# Patient Record
Sex: Male | Born: 1960 | Race: White | Hispanic: No | Marital: Married | Smoking: Current every day smoker
Health system: Southern US, Community
[De-identification: ages and names within clinical notes are randomized; demographics above are authoritative.]

## PROBLEM LIST (undated history)

## (undated) DIAGNOSIS — Z87442 Personal history of urinary calculi: Secondary | ICD-10-CM

## (undated) DIAGNOSIS — E7800 Pure hypercholesterolemia, unspecified: Secondary | ICD-10-CM

## (undated) DIAGNOSIS — N200 Calculus of kidney: Secondary | ICD-10-CM

## (undated) DIAGNOSIS — G4730 Sleep apnea, unspecified: Secondary | ICD-10-CM

## (undated) HISTORY — PX: HERNIA REPAIR: SHX51

## (undated) HISTORY — DX: Calculus of kidney: N20.0

---
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
## 1997-02-16 HISTORY — PX: HAND SURGERY: SHX662

## 2006-09-20 ENCOUNTER — Emergency Department: Payer: Self-pay | Admitting: Emergency Medicine

## 2007-04-06 ENCOUNTER — Emergency Department: Payer: Self-pay | Admitting: Emergency Medicine

## 2007-08-13 ENCOUNTER — Emergency Department: Payer: Self-pay | Admitting: Internal Medicine

## 2011-04-29 ENCOUNTER — Emergency Department: Payer: Self-pay

## 2011-05-01 ENCOUNTER — Emergency Department: Payer: Self-pay | Admitting: Emergency Medicine

## 2011-05-03 LAB — WOUND CULTURE

## 2011-09-29 ENCOUNTER — Emergency Department: Payer: Self-pay | Admitting: Emergency Medicine

## 2011-10-02 ENCOUNTER — Emergency Department: Payer: Self-pay | Admitting: *Deleted

## 2014-02-02 DIAGNOSIS — Z720 Tobacco use: Secondary | ICD-10-CM | POA: Insufficient documentation

## 2014-02-02 DIAGNOSIS — I10 Essential (primary) hypertension: Secondary | ICD-10-CM | POA: Insufficient documentation

## 2014-12-12 ENCOUNTER — Emergency Department: Payer: PRIVATE HEALTH INSURANCE

## 2014-12-12 ENCOUNTER — Emergency Department
Admission: EM | Admit: 2014-12-12 | Discharge: 2014-12-12 | Disposition: A | Discharge status: Home / Self Care | Payer: PRIVATE HEALTH INSURANCE | Attending: Student | Admitting: Student

## 2014-12-12 DIAGNOSIS — N201 Calculus of ureter: Secondary | ICD-10-CM

## 2014-12-12 DIAGNOSIS — R109 Unspecified abdominal pain: Secondary | ICD-10-CM | POA: Diagnosis present

## 2014-12-12 DIAGNOSIS — R52 Pain, unspecified: Secondary | ICD-10-CM

## 2014-12-12 DIAGNOSIS — Z79899 Other long term (current) drug therapy: Secondary | ICD-10-CM | POA: Insufficient documentation

## 2014-12-12 DIAGNOSIS — Z720 Tobacco use: Secondary | ICD-10-CM | POA: Insufficient documentation

## 2014-12-12 HISTORY — DX: Sleep apnea, unspecified: G47.30

## 2014-12-12 HISTORY — DX: Pure hypercholesterolemia, unspecified: E78.00

## 2014-12-12 LAB — COMPREHENSIVE METABOLIC PANEL
ALBUMIN: 4 g/dL (ref 3.5–5.0)
ALK PHOS: 107 U/L (ref 38–126)
ALT: 21 U/L (ref 17–63)
AST: 18 U/L (ref 15–41)
Anion gap: 8 (ref 5–15)
BUN: 12 mg/dL (ref 6–20)
CALCIUM: 9 mg/dL (ref 8.9–10.3)
CO2: 25 mmol/L (ref 22–32)
CREATININE: 0.94 mg/dL (ref 0.61–1.24)
Chloride: 103 mmol/L (ref 101–111)
GFR calc Af Amer: >60 mL/min (ref >60)
GFR calc non Af Amer: >60 mL/min (ref >60)
GLUCOSE: 137 mg/dL — AB (ref 65–99)
Potassium: 4.3 mmol/L (ref 3.5–5.1)
SODIUM: 136 mmol/L (ref 135–145)
Total Bilirubin: 0.5 mg/dL (ref 0.3–1.2)
Total Protein: 7.7 g/dL (ref 6.5–8.1)

## 2014-12-12 LAB — CBC WITH DIFFERENTIAL/PLATELET
Basophils Absolute: 0.1 10*3/uL (ref 0–0.1)
Basophils Relative: 1 %
Eosinophils Absolute: 0 10*3/uL (ref 0–0.7)
Eosinophils Relative: 0 %
HCT: 46.9 % (ref 40.0–52.0)
HEMOGLOBIN: 15.7 g/dL (ref 13.0–18.0)
Lymphocytes Relative: 10 %
Lymphs Abs: 1.8 10*3/uL (ref 1.0–3.6)
MCH: 31.1 pg (ref 26.0–34.0)
MCHC: 33.4 g/dL (ref 32.0–36.0)
MCV: 93.1 fL (ref 80.0–100.0)
MONO ABS: 0.6 10*3/uL (ref 0.2–1.0)
MONOS PCT: 3 %
Neutro Abs: 15.7 10*3/uL — ABNORMAL HIGH (ref 1.4–6.5)
Neutrophils Relative %: 86 %
Platelets: 303 10*3/uL (ref 150–440)
RBC: 5.04 MIL/uL (ref 4.40–5.90)
RDW: 13.2 % (ref 11.5–14.5)
WBC: 18.2 10*3/uL — ABNORMAL HIGH (ref 3.8–10.6)

## 2014-12-12 LAB — URINALYSIS COMPLETE WITH MICROSCOPIC (ARMC ONLY)
BACTERIA UA: NONE SEEN
Bilirubin Urine: NEGATIVE
Glucose, UA: NEGATIVE mg/dL
Leukocytes, UA: NEGATIVE
NITRITE: NEGATIVE
PH: 5 (ref 5.0–8.0)
PROTEIN: NEGATIVE mg/dL
SPECIFIC GRAVITY, URINE: 1.028 (ref 1.005–1.030)
Squamous Epithelial / LPF: NONE SEEN

## 2014-12-12 LAB — LIPASE, BLOOD: Lipase: 19 U/L (ref 11–51)

## 2014-12-12 MED ORDER — MORPHINE SULFATE (PF) 4 MG/ML IV SOLN
4.0000 mg | Freq: Once | INTRAVENOUS | Status: AC
  Administered 2014-12-12: 4 mg via INTRAVENOUS
  Filled 2014-12-12: qty 1

## 2014-12-12 MED ORDER — ONDANSETRON HCL 4 MG/2ML IJ SOLN
4.0000 mg | Freq: Once | INTRAMUSCULAR | Status: AC
  Administered 2014-12-12: 4 mg via INTRAVENOUS
  Filled 2014-12-12: qty 2

## 2014-12-12 MED ORDER — KETOROLAC TROMETHAMINE 30 MG/ML IJ SOLN
30.0000 mg | Freq: Once | INTRAMUSCULAR | Status: AC
  Administered 2014-12-12: 30 mg via INTRAVENOUS
  Filled 2014-12-12: qty 1

## 2014-12-12 MED ORDER — ONDANSETRON 4 MG PO TBDP: 4.0000 mg | ORAL_TABLET | Freq: Three times a day (TID) | ORAL | Status: DC | PRN

## 2014-12-12 MED ORDER — TAMSULOSIN HCL 0.4 MG PO CAPS: 0.4000 mg | ORAL_CAPSULE | Freq: Every day | ORAL | Status: DC

## 2014-12-12 MED ORDER — SODIUM CHLORIDE 0.9 % IV BOLUS (SEPSIS)
1000.0000 mL | Freq: Once | INTRAVENOUS | Status: AC
  Administered 2014-12-12: 1000 mL via INTRAVENOUS

## 2014-12-12 MED ORDER — OXYCODONE HCL 5 MG PO TABS: 5.0000 mg | ORAL_TABLET | Freq: Four times a day (QID) | ORAL | Status: DC | PRN

## 2014-12-12 NOTE — ED Notes (Signed)
Patient transported to CT 

## 2014-12-12 NOTE — ED Notes (Signed)
Patient complains of sharp pain to right upper quadrant abdominal pain that started last night at 9pm.  Patient reports having normal BM this morning and is passing gas.

## 2014-12-12 NOTE — ED Provider Notes (Signed)
[facility] Emergency Department Provider Note  ____________________________________________  Time seen: Approximately 8:13 AM  I have reviewed the triage vital signs and the nursing notes.   HISTORY  Chief Complaint Abdominal Pain    HPI Sean Sosa is a 54 y.o. male with history of obstructive sleep apnea and hyperlipidemia who presents for evaluation of gradual onset right-sided abdominal pain which began at 9 PM last night and has been worsening. He reports it has been constant and waxing and waning, currently severe, no modifying factors. It feels sharp. He has had nausea but no vomiting, diarrhea, fevers or chills. No chest pain or difficulty breathing. No modifying factors. He has never had this before. No dysuria or hematuria.   Past Medical History  Diagnosis Date  . Sleep apnea   . Hypercholesteremia     There are no active problems to display for this patient.   Past Surgical History  Procedure Laterality Date  . Hernia repair  1970's  . Hand surgery  1999    Current Outpatient Rx  Name  Route  Sig  Dispense  Refill  . atorvastatin (LIPITOR) 20 MG tablet   Oral   Take 20 mg by mouth daily.         . ondansetron (ZOFRAN ODT) 4 MG disintegrating tablet   Oral   Take 1 tablet (4 mg total) by mouth every 8 (eight) hours as needed for nausea or vomiting.   12 tablet   0   . oxyCODONE (ROXICODONE) 5 MG immediate release tablet   Oral   Take 1 tablet (5 mg total) by mouth every 6 (six) hours as needed for moderate pain. Do not drive while taking this medication.   30 tablet   0   . sulfamethoxazole-trimethoprim (BACTRIM DS,SEPTRA DS) 800-160 MG tablet   Oral   Take 1 tablet by mouth every 12 (twelve) hours.   14 tablet   0   . tamsulosin (FLOMAX) 0.4 MG CAPS capsule   Oral   Take 1 capsule (0.4 mg total) by mouth daily.   30 capsule   0     Allergies Review of patient's allergies indicates no known  allergies.  Family History  Problem Relation Age of Onset  . Prostate cancer Father   . Hematuria Father     Social History Social History  Substance Use Topics  . Smoking status: Current Every Day Smoker  . Smokeless tobacco: None  . Alcohol Use: No    Review of Systems Constitutional: No fever/chills Eyes: No visual changes. ENT: No sore throat. Cardiovascular: Denies chest pain. Respiratory: Denies shortness of breath. Gastrointestinal: + abdominal pain.  + nausea, no vomiting.  No diarrhea.  No constipation. Genitourinary: Negative for dysuria. Musculoskeletal: Negative for back pain. Skin: Negative for rash. Neurological: Negative for headaches, focal weakness or numbness.  10-point ROS otherwise negative.  ____________________________________________   PHYSICAL EXAM:  VITAL SIGNS: ED Triage Vitals  Enc Vitals Group     BP 12/12/14 0750 159/99 mmHg     Pulse Rate 12/12/14 0750 71     Resp 12/12/14 0750 16     Temp 12/12/14 0750 97.5 F (36.4 C)     Temp Source 12/12/14 0750 Oral     SpO2 12/12/14 0750 96 %     Weight 12/12/14 0750 250 lb (113.399 kg)     Height 12/12/14 0750  (1.854 m)     Head Cir --      Peak Flow --  Pain Score 12/12/14 0750 10     Pain Loc --      Pain Edu? --      Excl. in GC? --     Constitutional: Alert and oriented. Intermittently in mild distress secondary to pain. Eyes: Conjunctivae are normal. PERRL. EOMI. Head: Atraumatic. Nose: No congestion/rhinnorhea. Mouth/Throat: Mucous membranes are moist.  Oropharynx non-erythematous. Neck: No stridor.   Cardiovascular: Normal rate, regular rhythm. Grossly normal heart sounds.  Good peripheral circulation. Respiratory: Normal respiratory effort.  No retractions. Lungs CTAB. Gastrointestinal: Soft with mild tenderness in the left mid-abdomen. No distention. + right CVA tenderness. Genitourinary: deferred Musculoskeletal: No lower extremity tenderness nor edema.  No joint  effusions. Neurologic:  Normal speech and language. No gross focal neurologic deficits are appreciated. No gait instability. Skin:  Skin is warm, dry and intact. No rash noted. Psychiatric: Mood and affect are normal. Speech and behavior are normal.  ____________________________________________   LABS (all labs ordered are listed, but only abnormal results are displayed)  Labs Reviewed  COMPREHENSIVE METABOLIC PANEL - Abnormal; Notable for the following:    Glucose, Bld 137 (*)    All other components within normal limits  CBC WITH DIFFERENTIAL/PLATELET - Abnormal; Notable for the following:    WBC 18.2 (*)    Neutro Abs 15.7 (*)    All other components within normal limits  URINALYSIS COMPLETEWITH MICROSCOPIC ([facility] ONLY) - Abnormal; Notable for the following:    Color, Urine YELLOW (*)    APPearance CLEAR (*)    Ketones, ur TRACE (*)    Hgb urine dipstick 1+ (*)    All other components within normal limits  LIPASE, BLOOD   ____________________________________________  EKG none ____________________________________________  RADIOLOGY  CT abdomen and pelvis  IMPRESSION: 1. 7 mm right UVJ calculus resulting in moderate right hydroureteronephrosis and right perinephric stranding. 2. Nonobstructing, 6 mm left renal calculus. ____________________________________________   PROCEDURES  Procedure(s) performed: None  Critical Care performed: No  ____________________________________________   INITIAL IMPRESSION / ASSESSMENT AND PLAN / ED COURSE  Pertinent labs & imaging results that were available during my care of the patient were reviewed by me and considered in my medical decision making (see chart for details).  Sean Sosa is a 54 y.o. male with history of obstructive sleep apnea and hyperlipidemia who presents for evaluation of gradual onset right-sided abdominal pain which began at 9 PM last night. On exam, he intermittently appears to be in mild distress  second to pain. Vital signs stable, he is afebrile. He does have right CVA tenderness as well as mild tenderness in the right mid abdomen. Labs reviewed and are notable for significant leukocytosis with white count 18,000. Normal CMP and lipase. Awaiting urinalysis and anticipate CT of the abdomen and pelvis to further evaluate cause of his pain. We'll treat his pain and give IV fluids.  ----------------------------------------- 12:07 PM on 12/12/2014 -----------------------------------------  Urinalysis with multiple red blood cells but does not appear consistent with infection. CT abdomen and pelvis shows 7 mm right UVJ stone. Discussed with Dr. Berneice HeinrichManny of urology who recommended discharge with expedient urology follow-up as the patient's pain is currently well controlled, he is afebrile. We discussed return precautions and patient and wife are comfortable with discharge plan. Will discharge with expectant management.  ____________________________________________   FINAL CLINICAL IMPRESSION(S) / ED DIAGNOSES  Final diagnoses:  Right sided abdominal pain  Pain  Ureterolithiasis      Gayla DossEryka A Chloie Loney, MD 12/15/14 2225

## 2014-12-13 ENCOUNTER — Encounter: Payer: Self-pay | Admitting: Obstetrics and Gynecology

## 2014-12-13 ENCOUNTER — Ambulatory Visit (INDEPENDENT_AMBULATORY_CARE_PROVIDER_SITE_OTHER): Payer: PRIVATE HEALTH INSURANCE | Admitting: Obstetrics and Gynecology

## 2014-12-13 VITALS — BP 148/84 | HR 80 | Resp 16 | Ht 73.0 in | Wt 270.4 lb

## 2014-12-13 DIAGNOSIS — N200 Calculus of kidney: Secondary | ICD-10-CM | POA: Diagnosis not present

## 2014-12-13 LAB — MICROSCOPIC EXAMINATION
Epithelial Cells (non renal): NONE SEEN /hpf (ref 0–10)
Renal Epithel, UA: NONE SEEN /hpf

## 2014-12-13 LAB — URINALYSIS, COMPLETE
Bilirubin, UA: NEGATIVE
GLUCOSE, UA: NEGATIVE
Ketones, UA: NEGATIVE
LEUKOCYTES UA: NEGATIVE
Nitrite, UA: NEGATIVE
PROTEIN UA: NEGATIVE
Specific Gravity, UA: >1.03 — ABNORMAL HIGH (ref 1.005–1.030)
UUROB: 0.2 mg/dL (ref 0.2–1.0)
pH, UA: 5 (ref 5.0–7.5)

## 2014-12-13 MED ORDER — OXYCODONE HCL 5 MG PO TABS: 5.0000 mg | ORAL_TABLET | Freq: Four times a day (QID) | ORAL | Status: DC | PRN

## 2014-12-13 MED ORDER — SULFAMETHOXAZOLE-TRIMETHOPRIM 800-160 MG PO TABS: 1.0000 | ORAL_TABLET | Freq: Two times a day (BID) | ORAL | Status: DC

## 2014-12-13 MED ORDER — TAMSULOSIN HCL 0.4 MG PO CAPS: 0.4000 mg | ORAL_CAPSULE | Freq: Every day | ORAL | Status: DC

## 2014-12-13 NOTE — Progress Notes (Signed)
12/13/2014 4:21 PM   Sean Sosa Sean Sosa Oct 28, 1960 [id number]  Referring provider: No referring provider defined for this encounter.  Chief Complaint  Patient presents with  . Nephrolithiasis  . Establish Care    HPI: Patient is a 54yo male presenting today for follow up after being seen in the ED yesterday for gradual onset of right sided abdominal pain.  CT renal stone study was performed remarkable for a 7 mm right UVJ calculus resulting in moderate right hydroureteronephrosis and right perinephric stranding. Nonobstructing, 6 mm left renal calculus. Dr. Berneice Heinrich with [facility] was consulted and recommend patient be managed medically and see if he would be able to pass the stone. He continues to complain of significant right-sided flank pain today. No urinary symptoms. He denies any fevers. He did vomit once this morning. His wife states that he has been taking at least 2 tabs of oxycodone at a time to manage his pain.  This is his first stone episode.  12/12/14 Cr 0.94 WBC 18.2  PMH: Past Medical History  Diagnosis Date  . Sleep apnea   . Hypercholesteremia     Surgical History: Past Surgical History  Procedure Laterality Date  . Hernia repair  1970's  . Hand surgery  1999    Home Medications:    Medication List       This list is accurate as of: 12/13/14  4:21 PM.  Always use your most recent med list.               atorvastatin 20 MG tablet  Commonly known as:  LIPITOR  Take 20 mg by mouth daily.     ondansetron 4 MG disintegrating tablet  Commonly known as:  ZOFRAN ODT  Take 1 tablet (4 mg total) by mouth every 8 (eight) hours as needed for nausea or vomiting.     oxyCODONE 5 MG immediate release tablet  Commonly known as:  ROXICODONE  Take 1 tablet (5 mg total) by mouth every 6 (six) hours as needed for moderate pain. Do not drive while taking this medication.     sulfamethoxazole-trimethoprim 800-160 MG tablet  Commonly known as:  BACTRIM  DS,SEPTRA DS  Take 1 tablet by mouth every 12 (twelve) hours.     tamsulosin 0.4 MG Caps capsule  Commonly known as:  FLOMAX  Take 1 capsule (0.4 mg total) by mouth daily.        Allergies: No Known Allergies  Family History: Family History  Problem Relation Age of Onset  . Prostate cancer Father   . Hematuria Father     Social History:  reports that he has been smoking.  He does not have any smokeless tobacco history on file. He reports that he does not drink alcohol. His drug history is not on file.  ROS: UROLOGY Frequent Urination?: No Hard to postpone urination?: No Burning/pain with urination?: No Get up at night to urinate?: No Leakage of urine?: No Urine stream starts and stops?: No Trouble starting stream?: Yes Do you have to strain to urinate?: Yes Blood in urine?: Yes Urinary tract infection?: Yes Sexually transmitted disease?: No Injury to kidneys or bladder?: No Painful intercourse?: No Weak stream?: No Erection problems?: No Penile pain?: No  Gastrointestinal Nausea?: Yes Vomiting?: Yes Indigestion/heartburn?: No Diarrhea?: Yes Constipation?: No  Constitutional Fever: Yes Night sweats?: No Weight loss?: No Fatigue?: No  Skin Skin rash/lesions?: No Itching?: No  Eyes Blurred vision?: No Double vision?: No  Ears/Nose/Throat Sore throat?: No Sinus problems?: No  Hematologic/Lymphatic Swollen glands?: No Easy bruising?: No  Cardiovascular Leg swelling?: No Chest pain?: No  Respiratory Cough?: Yes Shortness of breath?: Yes  Endocrine Excessive thirst?: No  Musculoskeletal Back pain?: No Joint pain?: No  Neurological Headaches?: No Dizziness?: No  Psychologic Depression?: No Anxiety?: No  Physical Exam: BP 148/84 mmHg  Pulse 80  Resp 16  Ht 6\' 1"  (1.854 m)  Wt 270 lb 6.4 oz (122.653 kg)  BMI 35.68 kg/m2  Constitutional:  Alert and oriented, No acute distress. HEENT: [place] AT, moist mucus membranes.  Trachea  midline, no masses. Cardiovascular: No clubbing, cyanosis, or edema. Respiratory: Normal respiratory effort, no increased work of breathing. GI: Abdomen is soft, nontender, nondistended, no abdominal masses GU: right CVA tenderness.  Skin: No rashes, bruises or suspicious lesions. Lymph: No cervical or inguinal adenopathy. Neurologic: Grossly intact, no focal deficits, moving all 4 extremities. Psychiatric: Normal mood and affect.  Laboratory Data:   Urinalysis    Component Value Date/Time   COLORURINE YELLOW* 12/12/2014 0921   APPEARANCEUR CLEAR* 12/12/2014 0921   LABSPEC 1.028 12/12/2014 0921   PHURINE 5.0 12/12/2014 0921   GLUCOSEU NEGATIVE 12/12/2014 0921   HGBUR 1+* 12/12/2014 0921   BILIRUBINUR NEGATIVE 12/12/2014 0921   KETONESUR TRACE* 12/12/2014 0921   PROTEINUR NEGATIVE 12/12/2014 0921   NITRITE NEGATIVE 12/12/2014 0921   LEUKOCYTESUR NEGATIVE 12/12/2014 0921    Pertinent Imaging: EXAM: CT ABDOMEN AND PELVIS WITHOUT CONTRAST  TECHNIQUE: Multidetector CT imaging of the abdomen and pelvis was performed following the standard protocol without IV contrast.  COMPARISON: None.  FINDINGS: Lower chest: Mild bibasilar scarring. Normal heart size.  Hepatobiliary: Normal liver. Normal gallbladder.  Pancreas: Normal.  Spleen: Normal.  Adrenals/Urinary Tract: Mild adrenal thickening as can be seen with hyperplasia. 6 mm nonobstructing left renal calculus. 7 mm right UVJ calculus resulting in moderate right hydroureteronephrosis and perinephric stranding. Bladder is otherwise unremarkable.  Stomach/Bowel: No bowel wall thickening or dilatation. No abdominal or pelvic free fluid. No pneumatosis, pneumoperitoneum or portal venous gas.  Vascular/Lymphatic: Normal caliber abdominal aorta with atherosclerosis. No abdominal or pelvic lymphadenopathy.  Other: No fluid collection or hematoma.  Musculoskeletal: No acute osseous abnormality. No  aggressive lytic or sclerotic osseous lesion. Mild degenerative disc disease at L1-2. Bilateral facet arthropathy at L5-S1.  IMPRESSION: 1. 7 mm right UVJ calculus resulting in moderate right hydroureteronephrosis and right perinephric stranding. 2. Nonobstructing, 6 mm left renal calculus.   Electronically Signed  By: Elige KoHetal Patel  On: 12/12/2014 10:33  Assessment & Plan:    1. Nephrolithiasis- 7 mm right UVJ calculus resulting in moderate right hydroureteronephrosis and right perinephric stranding. Nonobstructing, 6 mm left renal calculus. Elevated WBCs at 18 in ED.  Refilled oxycodone, flomax and started bactrim.  Patient will f/u in 1 week for recheck. He was advised to seek immediate medical attention for fevers, uncontrolled pain or vomiting. - Urinalysis, Complete  Return in about 1 week (around 12/20/2014) for also provide work note for 1 week please.  These notes generated with voice recognition software. I apologize for typographical errors.  Earlie LouLindsay Kalid Ghan, FNP  [facility] [address], [address] [place], [place] [id number] [phone]

## 2014-12-20 ENCOUNTER — Encounter: Payer: Self-pay | Admitting: Obstetrics and Gynecology

## 2014-12-20 ENCOUNTER — Ambulatory Visit (INDEPENDENT_AMBULATORY_CARE_PROVIDER_SITE_OTHER): Payer: PRIVATE HEALTH INSURANCE | Admitting: Obstetrics and Gynecology

## 2014-12-20 VITALS — BP 148/98 | HR 73 | Resp 16 | Ht 73.0 in | Wt 263.0 lb

## 2014-12-20 DIAGNOSIS — N200 Calculus of kidney: Secondary | ICD-10-CM

## 2014-12-20 DIAGNOSIS — R3129 Other microscopic hematuria: Secondary | ICD-10-CM | POA: Diagnosis not present

## 2014-12-20 LAB — URINALYSIS, COMPLETE
Bilirubin, UA: NEGATIVE
GLUCOSE, UA: NEGATIVE
Ketones, UA: NEGATIVE
Leukocytes, UA: NEGATIVE
NITRITE UA: NEGATIVE
PH UA: 5.5 (ref 5.0–7.5)
Protein, UA: NEGATIVE
Specific Gravity, UA: 1.025 (ref 1.005–1.030)
UUROB: 0.2 mg/dL (ref 0.2–1.0)

## 2014-12-20 LAB — MICROSCOPIC EXAMINATION
EPITHELIAL CELLS (NON RENAL): NONE SEEN /HPF (ref 0–10)
Renal Epithel, UA: NONE SEEN /hpf

## 2014-12-20 NOTE — Progress Notes (Signed)
12/20/2014 9:09 AM   Sean Sosa Katheran AweM Vanness 10-27-1960 [id number]  Referring provider: No referring provider defined for this encounter.  Chief Complaint  Patient presents with  . Nephrolithiasis  . Follow-up    HPI: Patient presents today for 1 week f/u for recheck on obstructing right UVJ calculus. Patient reports that he passed the stone and has brought it with him today for analysis.  He reports pain has completely resolved.   Previous History: Patient is a 54yo male presenting today for follow up after being seen in the ED yesterday for gradual onset of right sided abdominal pain. CT renal stone study was performed remarkable for a 7 mm right UVJ calculus resulting in moderate right hydroureteronephrosis and right perinephric stranding. Nonobstructing, 6 mm left renal calculus. Dr. Berneice HeinrichManny with [facility] was consulted and recommend patient be managed medically and see if he would be able to pass the stone. He continues to complain of significant right-sided flank pain today. No urinary symptoms. He denies any fevers. He did vomit once this morning. His wife states that he has been taking at least 2 tabs of oxycodone at a time to manage his pain.  This is his first stone episode.  12/12/14 Cr 0.94 WBC 18.2    PMH: Past Medical History  Diagnosis Date  . Sleep apnea   . Hypercholesteremia     Surgical History: Past Surgical History  Procedure Laterality Date  . Hernia repair  1970's  . Hand surgery  1999    Home Medications:    Medication List       This list is accurate as of: 12/20/14  9:09 AM.  Always use your most recent med list.               atorvastatin 20 MG tablet  Commonly known as:  LIPITOR  Take 20 mg by mouth daily.     ondansetron 4 MG disintegrating tablet  Commonly known as:  ZOFRAN ODT  Take 1 tablet (4 mg total) by mouth every 8 (eight) hours as needed for nausea or vomiting.     oxyCODONE 5 MG immediate release tablet  Commonly known  as:  ROXICODONE  Take 1 tablet (5 mg total) by mouth every 6 (six) hours as needed for moderate pain. Do not drive while taking this medication.     sulfamethoxazole-trimethoprim 800-160 MG tablet  Commonly known as:  BACTRIM DS,SEPTRA DS  Take 1 tablet by mouth every 12 (twelve) hours.     tamsulosin 0.4 MG Caps capsule  Commonly known as:  FLOMAX  Take 1 capsule (0.4 mg total) by mouth daily.        Allergies: No Known Allergies  Family History: Family History  Problem Relation Age of Onset  . Prostate cancer Father   . Hematuria Father     Social History:  reports that he has been smoking.  He does not have any smokeless tobacco history on file. He reports that he does not drink alcohol. His drug history is not on file.  ROS: UROLOGY Frequent Urination?: No Hard to postpone urination?: No Burning/pain with urination?: No Get up at night to urinate?: No Leakage of urine?: No Urine stream starts and stops?: No Trouble starting stream?: No Do you have to strain to urinate?: No Blood in urine?: No Urinary tract infection?: No Sexually transmitted disease?: No Injury to kidneys or bladder?: No Painful intercourse?: No Weak stream?: No Erection problems?: No Penile pain?: No  Gastrointestinal Nausea?: No Vomiting?: No  Indigestion/heartburn?: No Diarrhea?: No Constipation?: No  Constitutional Fever: No Night sweats?: No Weight loss?: No Fatigue?: No  Skin Skin rash/lesions?: No Itching?: No  Eyes Blurred vision?: No Double vision?: No  Ears/Nose/Throat Sore throat?: No Sinus problems?: No  Hematologic/Lymphatic Swollen glands?: No Easy bruising?: No  Cardiovascular Leg swelling?: No Chest pain?: No  Respiratory Cough?: No Shortness of breath?: No  Endocrine Excessive thirst?: No  Musculoskeletal Back pain?: No Joint pain?: No  Neurological Headaches?: No Dizziness?: No  Psychologic Depression?: No Anxiety?: No  Physical  Exam: BP 148/98 mmHg  Pulse 73  Resp 16  Ht  (1.854 m)  Wt 263 lb (119.296 kg)  BMI 34.71 kg/m2  Constitutional:  Alert and oriented, No acute distress. HEENT: [place] AT, moist mucus membranes.  Trachea midline, no masses. Cardiovascular: No clubbing, cyanosis, or edema. Respiratory: Normal respiratory effort, no increased work of breathing. GI: Abdomen is soft, nontender, nondistended, no abdominal masses GU: No CVA tenderness. Skin: No rashes, bruises or suspicious lesions. Lymph: No cervical or inguinal adenopathy. Neurologic: Grossly intact, no focal deficits, moving all 4 extremities. Psychiatric: Normal mood and affect.  Laboratory Data:   Urinalysis    Component Value Date/Time   COLORURINE YELLOW* 12/12/2014 0921   APPEARANCEUR CLEAR* 12/12/2014 0921   LABSPEC 1.028 12/12/2014 0921   PHURINE 5.0 12/12/2014 0921   GLUCOSEU Negative 12/13/2014 1431   HGBUR 1+* 12/12/2014 0921   BILIRUBINUR Negative 12/13/2014 1431   BILIRUBINUR NEGATIVE 12/12/2014 0921   KETONESUR TRACE* 12/12/2014 0921   PROTEINUR NEGATIVE 12/12/2014 0921   NITRITE Negative 12/13/2014 1431   NITRITE NEGATIVE 12/12/2014 0921   LEUKOCYTESUR Negative 12/13/2014 1431   LEUKOCYTESUR NEGATIVE 12/12/2014 0921    Pertinent Imaging:  Assessment & Plan:   1. Nephrolithiasis- 7 mm right UVJ calculus resulting in moderate right hydroureteronephrosis and right perinephric stranding. Nonobstructing, 6 mm left renal calculus. Patient passed stone.  Pain resolved. RTC in 4 weeks RUS prior to confirm resolution f- Urinalysis, Complete -Stone Analysis  2.  Microscopic Hematuria- Most likely caused by stone passage.  Will recheck next visit.  There are no diagnoses linked to this encounter.  Return in about 5 weeks (around 01/24/2015) for to discuss stone analysis, UA recheck, and RUS results.  These notes generated with voice recognition software. I apologize for typographical errors.  Earlie Lou,  FNP  [facility] [address], [address] [place], [place] [address] [phone]

## 2015-01-01 ENCOUNTER — Telehealth: Payer: Self-pay | Admitting: Radiology

## 2015-01-01 NOTE — Telephone Encounter (Signed)
Pt's wife called requesting refill of oxycodone. Pt is having L flank pain. Pt is scheduled for RUS 12/2 & f/u with Earlie LouLindsay Overton 12/8. Please advise.

## 2015-01-02 ENCOUNTER — Other Ambulatory Visit: Payer: Self-pay | Admitting: Radiology

## 2015-01-02 ENCOUNTER — Encounter: Payer: Self-pay | Admitting: Urology

## 2015-01-02 DIAGNOSIS — N200 Calculus of kidney: Secondary | ICD-10-CM

## 2015-01-02 NOTE — Telephone Encounter (Signed)
Wife notified of Lindsay's message below. Advised pt to try to get an earlier appt for the RUS and KUB & to take Motrin and Tylenol as needed for pain until imaging can be done. Wife voices understanding.

## 2015-01-02 NOTE — Telephone Encounter (Signed)
You please call this patient and obtain further information about his pain. He did have a stone seen in his left kidney on previous imaging. This could be was causing his pain now. I would like him to have a KUB and an ultrasound prior to us prescribing any further pain medication to see if he has an obstructing stone on the left side. He can take Motrin and Tylenol as needed for the pain prior to the KUB and ultrasound results.  Please order the images study if patient is willing.  Thanks

## 2015-01-02 NOTE — Telephone Encounter (Signed)
LMOM to return call.

## 2015-01-18 ENCOUNTER — Ambulatory Visit
Admission: RE | Admit: 2015-01-18 | Discharge: 2015-01-18 | Disposition: A | Discharge status: Home / Self Care | Payer: PRIVATE HEALTH INSURANCE | Source: Ambulatory Visit | Attending: Obstetrics and Gynecology | Admitting: Obstetrics and Gynecology

## 2015-01-18 DIAGNOSIS — N200 Calculus of kidney: Secondary | ICD-10-CM

## 2015-01-21 ENCOUNTER — Encounter: Payer: Self-pay | Admitting: *Deleted

## 2015-01-24 ENCOUNTER — Ambulatory Visit (INDEPENDENT_AMBULATORY_CARE_PROVIDER_SITE_OTHER): Payer: PRIVATE HEALTH INSURANCE | Admitting: Obstetrics and Gynecology

## 2015-01-24 ENCOUNTER — Encounter: Payer: Self-pay | Admitting: Obstetrics and Gynecology

## 2015-01-24 VITALS — BP 144/83 | HR 77 | Resp 16 | Ht 73.0 in | Wt 265.3 lb

## 2015-01-24 DIAGNOSIS — N200 Calculus of kidney: Secondary | ICD-10-CM | POA: Diagnosis not present

## 2015-01-24 LAB — MICROSCOPIC EXAMINATION
BACTERIA UA: NONE SEEN
Epithelial Cells (non renal): NONE SEEN /hpf (ref 0–10)
WBC UA: NONE SEEN /HPF (ref 0–?)

## 2015-01-24 LAB — URINALYSIS, COMPLETE
BILIRUBIN UA: NEGATIVE
Glucose, UA: NEGATIVE
Ketones, UA: NEGATIVE
Leukocytes, UA: NEGATIVE
NITRITE UA: NEGATIVE
PH UA: 5 (ref 5.0–7.5)
PROTEIN UA: NEGATIVE
Specific Gravity, UA: <1.005 — ABNORMAL LOW (ref 1.005–1.030)
UUROB: 0.2 mg/dL (ref 0.2–1.0)

## 2015-01-24 NOTE — Progress Notes (Signed)
01/24/2015 8:59 AM   Sean Sosa 1960/02/28 [id number]  Referring provider: No referring provider defined for this encounter.  Chief Complaint  Patient presents with  . Nephrolithiasis    f/u    HPI: Patient is a 54 year old male with a history of kidney stones status post passage of a 7 mm right UVJ calculus one month ago. He presents today for follow-up renal ultrasound results and to review stone analysis report. He reports that he has been feeling well. Denies any fevers or flank pain.  Stone composition:  Calcium oxalate monohydrate 97% Calcium phosphate carbonate 1% Organic material 2%  RUS 01/18/15 IMPRESSION: 1. Neither kidney exhibits hydronephrosis today. Bilateral ureteral jets are observed in the bladder. 2. There is a stable 6 mm diameter nonobstructing lower pole stone on the left.  PMH: Past Medical History  Diagnosis Date  . Sleep apnea   . Hypercholesteremia   . Nephrolithiasis     Surgical History: Past Surgical History  Procedure Laterality Date  . Hernia repair  1970's  . Hand surgery  1999    Home Medications:    Medication List       This list is accurate as of: 01/24/15  8:59 AM.  Always use your most recent med list.               aspirin EC 81 MG tablet  Take 81 mg by mouth.     atorvastatin 40 MG tablet  Commonly known as:  LIPITOR     ondansetron 4 MG disintegrating tablet  Commonly known as:  ZOFRAN ODT  Take 1 tablet (4 mg total) by mouth every 8 (eight) hours as needed for nausea or vomiting.     RA NICOTINE 21 mg/24hr patch  Generic drug:  nicotine  Place onto the skin.     tamsulosin 0.4 MG Caps capsule  Commonly known as:  FLOMAX  Take 1 capsule (0.4 mg total) by mouth daily.        Allergies: No Known Allergies  Family History: Family History  Problem Relation Age of Onset  . Prostate cancer Father   . Hematuria Father     Social History:  reports that he has been smoking.  He does not have any  smokeless tobacco history on file. He reports that he does not drink alcohol. His drug history is not on file.  ROS: UROLOGY Frequent Urination?: No Hard to postpone urination?: No Burning/pain with urination?: No Get up at night to urinate?: No Leakage of urine?: No Urine stream starts and stops?: No Trouble starting stream?: No Do you have to strain to urinate?: No Blood in urine?: No Urinary tract infection?: No Sexually transmitted disease?: No Injury to kidneys or bladder?: No Painful intercourse?: No Weak stream?: No Erection problems?: No Penile pain?: No  Gastrointestinal Nausea?: No Vomiting?: No Indigestion/heartburn?: No Diarrhea?: No Constipation?: No  Constitutional Fever: No Night sweats?: No Weight loss?: No Fatigue?: No  Skin Skin rash/lesions?: No Itching?: No  Eyes Blurred vision?: No Double vision?: No  Ears/Nose/Throat Sore throat?: No Sinus problems?: No  Hematologic/Lymphatic Swollen glands?: No Easy bruising?: No  Cardiovascular Leg swelling?: No Chest pain?: No  Respiratory Cough?: No Shortness of breath?: No  Endocrine Excessive thirst?: No  Musculoskeletal Back pain?: No Joint pain?: No  Neurological Headaches?: No Dizziness?: No  Psychologic Depression?: No Anxiety?: No  Physical Exam: BP 144/83 mmHg  Pulse 77  Resp 16  Ht  (1.854 m)  Wt 265 lb 4.8  oz (120.339 kg)  BMI 35.01 kg/m2  Constitutional:  Alert and oriented, No acute distress. HEENT: [place] AT, moist mucus membranes.  Trachea midline, no masses. Cardiovascular: No clubbing, cyanosis, or edema. Respiratory: Normal respiratory effort, no increased work of breathing. Skin: No rashes, bruises or suspicious lesions. Neurologic: Grossly intact, no focal deficits, moving all 4 extremities. Psychiatric: Normal mood and affect.  Laboratory Data:   Urinalysis    Component Value Date/Time   COLORURINE YELLOW* 12/12/2014 0921   APPEARANCEUR CLEAR*  12/12/2014 0921   LABSPEC 1.028 12/12/2014 0921   PHURINE 5.0 12/12/2014 0921   GLUCOSEU Negative 12/20/2014 0849   HGBUR 1+* 12/12/2014 0921   BILIRUBINUR Negative 12/20/2014 0849   BILIRUBINUR NEGATIVE 12/12/2014 0921   KETONESUR TRACE* 12/12/2014 0921   PROTEINUR NEGATIVE 12/12/2014 0921   NITRITE Negative 12/20/2014 0849   NITRITE NEGATIVE 12/12/2014 0921   LEUKOCYTESUR Negative 12/20/2014 0849   LEUKOCYTESUR NEGATIVE 12/12/2014 0921    Pertinent Imaging: CLINICAL DATA: Right sided abdominal pain since 9pm last night. NKI. No hx of stones. Pt states his urine is dark, but does not believe it is blood. Patient reports having normal BM this morning and is passing gas.  EXAM: CT ABDOMEN AND PELVIS WITHOUT CONTRAST  TECHNIQUE: Multidetector CT imaging of the abdomen and pelvis was performed following the standard protocol without IV contrast.  COMPARISON: None.  FINDINGS: Lower chest: Mild bibasilar scarring. Normal heart size.  Hepatobiliary: Normal liver. Normal gallbladder.  Pancreas: Normal.  Spleen: Normal.  Adrenals/Urinary Tract: Mild adrenal thickening as can be seen with hyperplasia. 6 mm nonobstructing left renal calculus. 7 mm right UVJ calculus resulting in moderate right hydroureteronephrosis and perinephric stranding. Bladder is otherwise unremarkable.  Stomach/Bowel: No bowel wall thickening or dilatation. No abdominal or pelvic free fluid. No pneumatosis, pneumoperitoneum or portal venous gas.  Vascular/Lymphatic: Normal caliber abdominal aorta with atherosclerosis. No abdominal or pelvic lymphadenopathy.  Other: No fluid collection or hematoma.  Musculoskeletal: No acute osseous abnormality. No aggressive lytic or sclerotic osseous lesion. Mild degenerative disc disease at L1-2. Bilateral facet arthropathy at L5-S1.  IMPRESSION: 1. 7 mm right UVJ calculus resulting in moderate right hydroureteronephrosis and right  perinephric stranding. 2. Nonobstructing, 6 mm left renal calculus. Electronically Signed  By: Elige KoHetal Patel  On: 12/12/2014 10:33  Assessment & Plan:    1. Nephrolithiasis- UA unremarkable today. Patient denies pain or urinary symptoms. Renal ultrasound results and stone analysis report reviewed with patient and his questions were answered. We discussed general stone prevention techniques including drinking plenty water with goal of producing 2.5 L urine daily, increased citric acid intake, avoidance of high oxalate containing foods, and decreased salt intake.  Information about dietary recommendations given today. He declines further metabolic workup at this time.   Follow up in 1 year. KUB prior.    - Urinalysis, Complete   Return in about 1 year (around 01/24/2016) for KUB prior; f/u renal stones.  These notes generated with voice recognition software. I apologize for typographical errors.  Earlie LouLindsay Yahira Timberman, FNP  [facility] [address], [address] [place], [place] [address] [phone]

## 2015-01-24 NOTE — Patient Instructions (Signed)
Dietary Guidelines to Help Prevent Kidney Stones Your risk of kidney stones can be decreased by adjusting the foods you eat. The most important thing you can do is drink enough fluid. You should drink enough fluid to keep your urine clear or pale yellow. The following guidelines provide specific information for the type of kidney stone you have had. GUIDELINES ACCORDING TO TYPE OF KIDNEY STONE Calcium Oxalate Kidney Stones  Reduce the amount of salt you eat. Foods that have a lot of salt cause your body to release excess calcium into your urine. The excess calcium can combine with a substance called oxalate to form kidney stones.  Reduce the amount of animal protein you eat if the amount you eat is excessive. Animal protein causes your body to release excess calcium into your urine. Ask your dietitian how much protein from animal sources you should be eating.  Avoid foods that are high in oxalates. If you take vitamins, they should have less than 500 mg of vitamin C. Your body turns vitamin C into oxalates. You do not need to avoid fruits and vegetables high in vitamin C. Calcium Phosphate Kidney Stones  Reduce the amount of salt you eat to help prevent the release of excess calcium into your urine.  Reduce the amount of animal protein you eat if the amount you eat is excessive. Animal protein causes your body to release excess calcium into your urine. Ask your dietitian how much protein from animal sources you should be eating.  Get enough calcium from food or take a calcium supplement (ask your dietitian for recommendations). Food sources of calcium that do not increase your risk of kidney stones include:  Broccoli.  Dairy products, such as cheese and yogurt.  Pudding. Uric Acid Kidney Stones  Do not have more than 6 oz of animal protein per day. FOOD SOURCES Animal Protein Sources  Meat (all types).  Poultry.  Eggs.  Fish, seafood. Foods High in Salt  Salt seasonings.  Soy  sauce.  Teriyaki sauce.  Cured and processed meats.  Salted crackers and snack foods.  Fast food.  Canned soups and most canned foods. Foods High in Oxalates  Grains:  Amaranth.  Barley.  Grits.  Wheat germ.  Bran.  Buckwheat flour.  All bran cereals.  Pretzels.  Whole wheat bread.  Vegetables:  Beans (wax).  Beets and beet greens.  Collard greens.  Eggplant.  Escarole.  Leeks.  Okra.  Parsley.  Rutabagas.  Spinach.  Swiss chard.  Tomato paste.  Fried potatoes.  Sweet potatoes.  Fruits:  Red currants.  Figs.  Kiwi.  Rhubarb.  Meat and Other Protein Sources:  Beans (dried).  Soy burgers and other soybean products.  Miso.  Nuts (peanuts, almonds, pecans, cashews, hazelnuts).  Nut butters.  Sesame seeds and tahini (paste made of sesame seeds).  Poppy seeds.  Beverages:  Chocolate drink mixes.  Soy milk.  Instant iced tea.  Juices made from high-oxalate fruits or vegetables.  Other:  Carob.  Chocolate.  Fruitcake.  Marmalades.   This information is not intended to replace advice given to you by your health care provider. Make sure you discuss any questions you have with your health care provider.   Document Released: 05/30/2010 Document Revised: 02/07/2013 Document Reviewed: 12/30/2012 Elsevier Interactive Patient Education 2016 Elsevier Inc.  

## 2015-02-14 ENCOUNTER — Other Ambulatory Visit: Payer: Self-pay | Admitting: Obstetrics and Gynecology

## 2015-09-11 DIAGNOSIS — G4733 Obstructive sleep apnea (adult) (pediatric): Secondary | ICD-10-CM | POA: Insufficient documentation

## 2016-01-24 ENCOUNTER — Ambulatory Visit: Payer: PRIVATE HEALTH INSURANCE | Admitting: Urology

## 2017-05-16 ENCOUNTER — Other Ambulatory Visit: Payer: Self-pay

## 2017-05-16 ENCOUNTER — Emergency Department
Admission: EM | Admit: 2017-05-16 | Discharge: 2017-05-16 | Disposition: A | Discharge status: Home / Self Care | Payer: 59 | Attending: Emergency Medicine | Admitting: Emergency Medicine

## 2017-05-16 ENCOUNTER — Encounter: Payer: Self-pay | Admitting: Emergency Medicine

## 2017-05-16 DIAGNOSIS — I10 Essential (primary) hypertension: Secondary | ICD-10-CM | POA: Insufficient documentation

## 2017-05-16 DIAGNOSIS — Z79899 Other long term (current) drug therapy: Secondary | ICD-10-CM | POA: Diagnosis not present

## 2017-05-16 DIAGNOSIS — M545 Low back pain, unspecified: Secondary | ICD-10-CM

## 2017-05-16 DIAGNOSIS — F17210 Nicotine dependence, cigarettes, uncomplicated: Secondary | ICD-10-CM | POA: Insufficient documentation

## 2017-05-16 DIAGNOSIS — Z7982 Long term (current) use of aspirin: Secondary | ICD-10-CM | POA: Insufficient documentation

## 2017-05-16 MED ORDER — KETOROLAC TROMETHAMINE 30 MG/ML IJ SOLN
30.0000 mg | Freq: Once | INTRAMUSCULAR | Status: AC
  Administered 2017-05-16: 30 mg via INTRAMUSCULAR
  Filled 2017-05-16: qty 1

## 2017-05-16 MED ORDER — LIDOCAINE 5 % EX PTCH: 1.0000 | MEDICATED_PATCH | Freq: Two times a day (BID) | CUTANEOUS | 0 refills | Status: AC

## 2017-05-16 MED ORDER — METHOCARBAMOL 500 MG PO TABS: 500.0000 mg | ORAL_TABLET | Freq: Four times a day (QID) | ORAL | 0 refills | Status: DC

## 2017-05-16 MED ORDER — KETOROLAC TROMETHAMINE 10 MG PO TABS: 10.0000 mg | ORAL_TABLET | Freq: Four times a day (QID) | ORAL | 0 refills | Status: DC | PRN

## 2017-05-16 MED ORDER — OXYCODONE-ACETAMINOPHEN 5-325 MG PO TABS
1.0000 | ORAL_TABLET | Freq: Once | ORAL | Status: AC
  Administered 2017-05-16: 1 via ORAL
  Filled 2017-05-16: qty 1

## 2017-05-16 NOTE — ED Triage Notes (Signed)
Pt arrived via POV from home c/o lower back pain. Pt states the pain started Wednesday, states the pain is all across lower back, denies any injury, states can be painful when sitting. Back pain improves when moving. Pt not taking any medications for the pain.  Pt states the pain has improved today since Wednesday.

## 2017-05-16 NOTE — ED Notes (Signed)
See triage note  Presents with lower back pain which started on weds.. Thought he may have twisted it.. States pain is worse with sitting but eases off with standing/walking ambulates well to treatment room

## 2017-05-16 NOTE — ED Provider Notes (Signed)
[facility] Emergency Department Provider Note  ____________________________________________  Time seen: Approximately 10:10 AM  I have reviewed the triage vital signs and the nursing notes.   HISTORY  Chief Complaint Back Pain    HPI Sean Sosa is a 57 y.o. male that presents emergency department for evaluation of low back pain for 4 days.  Pain improves while walking around.  Pain improves when he "loosens up." Pain improved today from yesterday. No trauma. He drives 1.5 hours to and from work each day. He took flexeril without relief.  No bowel or bladder dysfunction or saddle paresthesias.  He denies nausea, vomiting, abdominal pain, dysuria, urgency, frequency, hematuria, leg pain, numbness, tingling.   Past Medical History:  Diagnosis Date  . Hypercholesteremia   . Nephrolithiasis   . Sleep apnea     Patient Active Problem List   Diagnosis Date Noted  . Essential (primary) hypertension 02/02/2014  . Current tobacco use 02/02/2014    Past Surgical History:  Procedure Laterality Date  . HAND SURGERY  1999  . HERNIA REPAIR  1970's    Prior to Admission medications   Medication Sig Start Date End Date Taking? Authorizing Provider  aspirin EC 81 MG tablet Take 81 mg by mouth. 11/24/13   [provider]  atorvastatin (LIPITOR) 40 MG tablet  01/09/15   [provider]  ketorolac (TORADOL) 10 MG tablet Take 1 tablet (10 mg total) by mouth every 6 (six) hours as needed. 05/16/17   Enid DerryWagner, Angles Trevizo, PA-C  lidocaine (LIDODERM) 5 % Place 1 patch onto the skin every 12 (twelve) hours. Remove & Discard patch within 12 hours or as directed by MD 05/16/17 05/16/18  Enid DerryWagner, Deborha Moseley, PA-C  methocarbamol (ROBAXIN) 500 MG tablet Take 1 tablet (500 mg total) by mouth 4 (four) times daily. 05/16/17   Enid DerryWagner, Anes Rigel, PA-C  nicotine (RA NICOTINE) 21 mg/24hr patch Place onto the skin. 11/24/13   [provider]  ondansetron (ZOFRAN ODT) 4 MG  disintegrating tablet Take 1 tablet (4 mg total) by mouth every 8 (eight) hours as needed for nausea or vomiting. 12/12/14   Gayla DossGayle, Eryka A, MD  tamsulosin (FLOMAX) 0.4 MG CAPS capsule Take 1 capsule (0.4 mg total) by mouth daily. 12/13/14   Fernanda Drumverton, Lindsay C, FNP    Allergies Patient has no known allergies.  Family History  Problem Relation Age of Onset  . Prostate cancer Father   . Hematuria Father     Social History Social History   Tobacco Use  . Smoking status: Current Every Day Smoker    Packs/day: 1.50    Types: Cigarettes  . Smokeless tobacco: Never Used  Substance Use Topics  . Alcohol use: No  . Drug use: Not on file     Review of Systems  Constitutional: No fever/chills Cardiovascular: No chest pain. Respiratory: No SOB. Gastrointestinal: No abdominal pain.  No nausea, no vomiting.  Musculoskeletal: Positive for back pain. Skin: Negative for rash, abrasions, lacerations, ecchymosis.   ____________________________________________   PHYSICAL EXAM:  VITAL SIGNS: ED Triage Vitals  Enc Vitals Group     BP 05/16/17 0943 (!) 143/90     Pulse Rate 05/16/17 0943 85     Resp 05/16/17 0943 18     Temp 05/16/17 0943 97.7 F (36.5 C)     Temp Source 05/16/17 0943 Oral     SpO2 05/16/17 0943 99 %     Weight 05/16/17 0943 250 lb (113.4 kg)     Height  05/16/17 0943 6\' 1"  (1.854 m)     Head Circumference --      Peak Flow --      Pain Score 05/16/17 0942 8     Pain Loc --      Pain Edu? --      Excl. in GC? --      Constitutional: Alert and oriented. Well appearing and in no acute distress. Eyes: Conjunctivae are normal. PERRL. EOMI. Head: Atraumatic. ENT:      Ears:      Nose: No congestion/rhinnorhea.      Mouth/Throat: Mucous membranes are moist.  Neck: No stridor.   Cardiovascular: Normal rate, regular rhythm.  Good peripheral circulation. Respiratory: Normal respiratory effort without tachypnea or retractions. Lungs CTAB. Good air entry to the  bases with no decreased or absent breath sounds. Musculoskeletal: Full range of motion to all extremities. No gross deformities appreciated.  Minimal lumbar paraspinal tenderness.  Strength equal in lower extremities bilaterally.  Negative straight leg raise. Neurologic:  Normal speech and language. No gross focal neurologic deficits are appreciated.  Skin:  Skin is warm, dry and intact. No rash noted.   ____________________________________________   LABS (all labs ordered are listed, but only abnormal results are displayed)  Labs Reviewed - No data to display ____________________________________________  EKG   ____________________________________________  RADIOLOGY   No results found.  ____________________________________________    PROCEDURES  Procedure(s) performed:    Procedures    Medications  ketorolac (TORADOL) 30 MG/ML injection 30 mg (30 mg Intramuscular Given 05/16/17 1038)  oxyCODONE-acetaminophen (PERCOCET/ROXICET) 5-325 MG per tablet 1 tablet (1 tablet Oral Given 05/16/17 1037)     ____________________________________________   INITIAL IMPRESSION / ASSESSMENT AND PLAN / ED COURSE  Pertinent labs & imaging results that were available during my care of the patient were reviewed by me and considered in my medical decision making (see chart for details).  Review of the [place] CSRS was performed in accordance of the [facility] prior to dispensing any controlled drugs.   Patient presented to the emergency department for low back pain for 4 days.  Vital signs and exam are reassuring.  Patient is walking comfortably around ER to the bathroom.  He was given a dose of Percocet and a Toradol shot for pain and states that symptoms have significantly improved.  Patient will be discharged home with prescriptions for lidoderm, Toradol and Robaxin.  Patient is to follow up with PCP as directed. Patient is given ED precautions to return to the ED for any worsening or new  symptoms.     ____________________________________________  FINAL CLINICAL IMPRESSION(S) / ED DIAGNOSES  Final diagnoses:  Acute bilateral low back pain without sciatica      NEW MEDICATIONS STARTED DURING THIS VISIT:  ED Discharge Orders        Ordered    ketorolac (TORADOL) 10 MG tablet  Every 6 hours PRN     05/16/17 1059    methocarbamol (ROBAXIN) 500 MG tablet  4 times daily     05/16/17 1059    lidocaine (LIDODERM) 5 %  Every 12 hours     05/16/17 1059          This chart was dictated using voice recognition software/Dragon. Despite best efforts to proofread, errors can occur which can change the meaning. Any change was purely unintentional.    Enid Derry, PA-C 05/16/17 1151    Arnaldo Natal, MD 05/16/17 1323

## 2017-11-15 ENCOUNTER — Other Ambulatory Visit: Payer: Self-pay | Admitting: Internal Medicine

## 2017-11-15 DIAGNOSIS — N433 Hydrocele, unspecified: Secondary | ICD-10-CM

## 2017-11-19 ENCOUNTER — Ambulatory Visit
Admission: RE | Admit: 2017-11-19 | Discharge: 2017-11-19 | Disposition: A | Discharge status: Home / Self Care | Payer: 59 | Source: Ambulatory Visit | Attending: Internal Medicine | Admitting: Internal Medicine

## 2017-11-19 DIAGNOSIS — N5089 Other specified disorders of the male genital organs: Secondary | ICD-10-CM | POA: Insufficient documentation

## 2017-11-19 DIAGNOSIS — N433 Hydrocele, unspecified: Secondary | ICD-10-CM

## 2018-08-07 NOTE — Progress Notes (Signed)
08/08/2018 9:01 AM   Sean Sosa 08-11-1960 [id number]  Referring provider: Maceo Pro, MD [place],  [place] [address]  Chief Complaint  Patient presents with  . Benign Prostatic Hypertrophy    HPI: Mr. Balan is a 58 year old Caucasian male with a history of nephrolithiasis and sleep apnea who is referred by Dr. Vikki Ports C. Ward for difficulty urinating.    BPH WITH LUTS  (prostate and/or bladder) IPSS score: 28/5    PVR: 50 mL      Major complaint(s):  Frequency, hesitancy, intermittency and a weak stream  x several months. Denies any dysuria, hematuria or suprapubic pain.  His UA is negative.    He states over the weekend his symptoms abated.    He was seen in this office in 2016 for a 7 mm right ureteral stone and a 6 mm left renal stone.  He spontaneously passed the stone and follow up RUS confirmed no hydronephrosis.  Stone composition: Calcium oxalate monohydrate 97%, Calcium phosphate carbonate 1% and Organic material 2%.    KUB on 08/08/2018 noted probable large left renal calculus. No evidence of bowel obstruction or ileus.  Scrotal ultrasound in 11/2017 for hydrocele evaluation by PCP noted no hydroceles.     Denies any recent fevers, chills, nausea or vomiting.  He does not have a family history of PCa.  IPSS    Row Name 08/08/18 0800         International Prostate Symptom Score   How often have you had the sensation of not emptying your bladder?  More than half the time     How often have you had to urinate less than every two hours?  Almost always     How often have you found you stopped and started again several times when you urinated?  Almost always     How often have you found it difficult to postpone urination?  Almost always     How often have you had a weak urinary stream?  More than half the time     How often have you had to strain to start urination?  More than half the time     How many times did you typically  get up at night to urinate?  1 Time     Total IPSS Score  28       Quality of Life due to urinary symptoms   If you were to spend the rest of your life with your urinary condition just the way it is now how would you feel about that?  Unhappy        Score:  1-7 Mild 8-19 Moderate 20-35 Severe  ED He has had ED for years.    PMH: Past Medical History:  Diagnosis Date  . Hypercholesteremia   . Nephrolithiasis   . Sleep apnea     Surgical History: Past Surgical History:  Procedure Laterality Date  . HAND SURGERY  1999  . HERNIA REPAIR  1970's    Home Medications:  Allergies as of 08/08/2018   No Known Allergies     Medication List       Accurate as of August 08, 2018  9:01 AM. If you have any questions, ask your nurse or doctor.        STOP taking these medications   atorvastatin 40 MG tablet Commonly known as: LIPITOR Stopped by: Jeffery Gammell, PA-C   ketorolac 10 MG tablet Commonly known as:  TORADOL Stopped by: Michiel CowboySHANNON Telena Peyser, PA-C   methocarbamol 500 MG tablet Commonly known as: Robaxin Stopped by: Milaina Sher, PA-C   ondansetron 4 MG disintegrating tablet Commonly known as: Zofran ODT Stopped by: Eddy Termine, PA-C   RA Nicotine 21 mg/24hr patch Generic drug: nicotine Stopped by: Michiel CowboySHANNON Ellason Segar, PA-C     TAKE these medications   aspirin EC 81 MG tablet Take 81 mg by mouth.   tamsulosin 0.4 MG Caps capsule Commonly known as: FLOMAX Take 1 capsule (0.4 mg total) by mouth daily.       Allergies: No Known Allergies  Family History: Family History  Problem Relation Age of Onset  . Prostate cancer Father   . Hematuria Father     Social History:  reports that he has been smoking cigarettes. He has been smoking about 1.50 packs per day. He has never used smokeless tobacco. He reports that he does not drink alcohol. No history on file for drug.  ROS: UROLOGY Frequent Urination?: Yes Hard to postpone urination?: No Burning/pain  with urination?: No Get up at night to urinate?: No Leakage of urine?: No Urine stream starts and stops?: Yes Trouble starting stream?: Yes Do you have to strain to urinate?: No Blood in urine?: No Urinary tract infection?: No Sexually transmitted disease?: No Injury to kidneys or bladder?: No Painful intercourse?: No Weak stream?: Yes Erection problems?: Yes Penile pain?: No  Gastrointestinal Nausea?: No Vomiting?: No Indigestion/heartburn?: No Diarrhea?: No Constipation?: No  Constitutional Fever: No Night sweats?: No Weight loss?: No Fatigue?: No  Skin Skin rash/lesions?: No Itching?: No  Eyes Blurred vision?: No Double vision?: No  Ears/Nose/Throat Sore throat?: No Sinus problems?: No  Hematologic/Lymphatic Swollen glands?: No Easy bruising?: No  Cardiovascular Leg swelling?: No Chest pain?: No  Respiratory Cough?: No Shortness of breath?: No  Endocrine Excessive thirst?: No  Musculoskeletal Back pain?: No Joint pain?: No  Neurological Headaches?: No Dizziness?: No  Psychologic Depression?: No Anxiety?: No  Physical Exam: BP (!) 170/108   Pulse 87   Ht 6\' 1"  (1.854 Sosa)   Wt 275 lb (124.7 kg)   BMI 36.28 kg/Sosa   Constitutional:  Well nourished. Alert and oriented, No acute distress. HEENT: [place] AT, moist mucus membranes.  Trachea midline, no masses. Cardiovascular: No clubbing, cyanosis, or edema. Respiratory: Normal respiratory effort, no increased work of breathing. GI: Abdomen is obese, soft, non tender, non distended, no abdominal masses. Liver and spleen not palpable.  No hernias appreciated.  Stool sample for occult testing is not indicated.   GU: No CVA tenderness.  No bladder fullness or masses.  Patient with circumcised phallus.   Urethral meatus is patent.  No penile discharge. No penile lesions or rashes. Scrotum without lesions, cysts, rashes and/or edema.  Testicles are located scrotally bilaterally. No masses are appreciated  in the testicles. Left and right epididymis are normal. Rectal: Patient with  normal sphincter tone. Anus and perineum without scarring or rashes. No rectal masses are appreciated. Prostate is approximately 60 grams, could only palpate apex and midportion of the glands, no nodules are appreciated.  Skin: No rashes, bruises or suspicious lesions. Lymph: No inguinal adenopathy. Neurologic: Grossly intact, no focal deficits, moving all 4 extremities. Psychiatric: Normal mood and affect.  Laboratory Data: Lab Results  Component Value Date   WBC 18.2 (H) 12/12/2014   HGB 15.7 12/12/2014   HCT 46.9 12/12/2014   MCV 93.1 12/12/2014   PLT 303 12/12/2014    Lab Results  Component Value  Date   CREATININE 0.94 12/12/2014    No results found for: PSA  No results found for: TESTOSTERONE  No results found for: HGBA1C  No results found for: TSH  No results found for: CHOL, HDL, CHOLHDL, VLDL, LDLCALC  Lab Results  Component Value Date   AST 18 12/12/2014   Lab Results  Component Value Date   ALT 21 12/12/2014   No components found for: ALKALINEPHOPHATASE No components found for: BILIRUBINTOTAL  No results found for: ESTRADIOL  Urinalysis Negative.  See Epic.  I have reviewed the labs.   Pertinent Imaging: Results for Leta BaptistHILLIPS, Sean Sosa (MRN [id number]) as of 08/08/2018 08:40  Ref. Range 08/08/2018 08:39  Scan Result Unknown 50ML   CLINICAL DATA:  Nephrolithiasis.  EXAM: ABDOMEN - 1 VIEW  COMPARISON:  CT scan of December 12, 2014.  FINDINGS: The bowel gas pattern is normal. 1 cm calculus is seen in the region of the lower pole of left kidney.  IMPRESSION: Probable large left renal calculus. No evidence of bowel obstruction or ileus.   Electronically Signed   By: Lupita RaiderJames  Green Jr Sosa.D.   On: 08/08/2018 10:41 I have independently reviewed the films and note the left renal calculus.   Assessment & Plan:    1. BPH with LUTS IPSS score is 28/5  Continue  conservative management, avoiding bladder irritants and timed voiding's Most bothersome symptoms is/are frequency, intermittency, hesitancy and weak stream.   Initiate alpha-blocker (tamsulosin 0.4 mg daily), discussed side effects  Hold on 5 alpha reductase inhibitor (finstaride), discussed side effects, will address again once ED is addressed PSA drawn today RTC in 1-2 months for IPS'S PVR - if PSA is stable  2. History of nephrolithiasis Spontaneous passage of right ureteral stone and present of left renal stone in 2016 KUB   3. HTN Encouraged patient to seek treatment with PCP as he is at risk for MI and CVA - also stated he would need to be seen and evaluated by his PCP prior to addressing and treating his ED    Return for pending KUB and PSA results .  These notes generated with voice recognition software. I apologize for typographical errors.  Michiel CowboySHANNON Talecia Sherlin, PA-C  [facility] [address]  [address] [place], [place] [address] [phone]

## 2018-08-08 ENCOUNTER — Telehealth: Payer: Self-pay | Admitting: Family Medicine

## 2018-08-08 ENCOUNTER — Other Ambulatory Visit: Payer: Self-pay

## 2018-08-08 ENCOUNTER — Ambulatory Visit
Admission: RE | Admit: 2018-08-08 | Discharge: 2018-08-08 | Disposition: A | Discharge status: Home / Self Care | Payer: PRIVATE HEALTH INSURANCE | Source: Ambulatory Visit | Attending: Urology | Admitting: Urology

## 2018-08-08 ENCOUNTER — Encounter: Payer: Self-pay | Admitting: Urology

## 2018-08-08 ENCOUNTER — Ambulatory Visit (INDEPENDENT_AMBULATORY_CARE_PROVIDER_SITE_OTHER): Payer: No Typology Code available for payment source | Admitting: Urology

## 2018-08-08 VITALS — BP 170/108 | HR 87 | Ht 73.0 in | Wt 275.0 lb

## 2018-08-08 DIAGNOSIS — I10 Essential (primary) hypertension: Secondary | ICD-10-CM | POA: Diagnosis not present

## 2018-08-08 DIAGNOSIS — Z87442 Personal history of urinary calculi: Secondary | ICD-10-CM | POA: Insufficient documentation

## 2018-08-08 DIAGNOSIS — N138 Other obstructive and reflux uropathy: Secondary | ICD-10-CM | POA: Diagnosis not present

## 2018-08-08 DIAGNOSIS — N401 Enlarged prostate with lower urinary tract symptoms: Secondary | ICD-10-CM | POA: Diagnosis not present

## 2018-08-08 LAB — URINALYSIS, COMPLETE
Bilirubin, UA: NEGATIVE
Glucose, UA: NEGATIVE
Ketones, UA: NEGATIVE
Leukocytes,UA: NEGATIVE
Nitrite, UA: NEGATIVE
Protein,UA: NEGATIVE
Specific Gravity, UA: <1.005 — ABNORMAL LOW (ref 1.005–1.030)
Urobilinogen, Ur: 0.2 mg/dL (ref 0.2–1.0)
pH, UA: 5 (ref 5.0–7.5)

## 2018-08-08 LAB — MICROSCOPIC EXAMINATION
Bacteria, UA: NONE SEEN
Epithelial Cells (non renal): NONE SEEN /hpf (ref 0–10)
RBC: NONE SEEN /hpf (ref 0–2)
WBC, UA: NONE SEEN /hpf (ref 0–5)

## 2018-08-08 LAB — BLADDER SCAN AMB NON-IMAGING

## 2018-08-08 MED ORDER — TAMSULOSIN HCL 0.4 MG PO CAPS: 0.4000 mg | ORAL_CAPSULE | Freq: Every day | ORAL | 3 refills | Status: DC

## 2018-08-08 NOTE — Telephone Encounter (Signed)
Patient notified and voiced understanding.

## 2018-08-08 NOTE — Telephone Encounter (Signed)
-----   Message from Nori Riis, PA-C sent at 08/08/2018 11:39 AM EDT ----- Please let Sean Sosa know that the xray shows the stone is still present in the lower pole of his left kidney.  It has increased in size since his CT scan in 2016.  It was 6 mm in size at that time and now it is 1 cm in size.  However, the stone is not the cause of his urinary symptoms.  If he would like to have the stone addressed, we can discuss this more at his return visit.  PSA level is still pending.  It should be available tomorrow.

## 2018-08-09 ENCOUNTER — Telehealth: Payer: Self-pay | Admitting: *Deleted

## 2018-08-09 ENCOUNTER — Telehealth: Payer: Self-pay | Admitting: Urology

## 2018-08-09 LAB — PSA: Prostate Specific Ag, Serum: 1.1 ng/mL (ref 0.0–4.0)

## 2018-08-09 NOTE — Telephone Encounter (Signed)
Would you schedule Sean Sosa for a follow up in August for an I PSS and PVR?

## 2018-08-09 NOTE — Telephone Encounter (Addendum)
Patient informed, verbalized understanding.    ----- Message from Nori Riis, PA-C sent at 08/09/2018  9:06 AM EDT ----- Please let Mr. Colston know that his PSA is normal at 1.1.

## 2018-09-21 NOTE — Progress Notes (Signed)
09/22/2018 1:13 PM   Marcial Pacasimothy Katheran AweM Fleishman September 04, 1960 [id number]  Referring provider: Fayrene HelperBoswell, Chelsa H, NP [address] [place],  [place] [id number]  Chief Complaint  Patient presents with  . Benign Prostatic Hypertrophy    HPI: Mr. Sean Sosa is a 58 year old male with a history of nephrolithiasis, sleep apnea and difficulty with urination who presents today for a follow up after starting tamsulosin.  BPH WITH LUTS  (prostate and/or bladder) I PSS score: 14/6    PVR: 8 mL       Previous IPSS score: 28/5    Previous  PVR: 50 mL      Major complaint(s):  Hesitancy x several months.  Denies any dysuria, hematuria or suprapubic pain.  He was started on tamsulosin 0.4 mg daily at his 08/08/2018 visit for frequency, intermittency, hesitancy and a weak urinary stream.  He feels the tamsulosin did ease his symptoms somewhat, but he is still experiencing significant symptoms.   Patient denies any gross hematuria, dysuria or suprapubic/flank pain.    Denies any recent fevers, chills, nausea or vomiting.  He does not have a family history of PCa.  IPSS    Row Name 09/22/18 1500         International Prostate Symptom Score   How often have you had the sensation of not emptying your bladder?  Less than 1 in 5     How often have you had to urinate less than every two hours?  Less than half the time     How often have you found you stopped and started again several times when you urinated?  Less than half the time     How often have you found it difficult to postpone urination?  Less than half the time     How often have you had a weak urinary stream?  About half the time     How often have you had to strain to start urination?  About half the time     How many times did you typically get up at night to urinate?  1 Time     Total IPSS Score  14       Quality of Life due to urinary symptoms   If you were to spend the rest of your life with your urinary condition just the way it is now how  would you feel about that?  Terrible        Score:  1-7 Mild 8-19 Moderate 20-35 Severe  ED He has had ED for years.    PMH: Past Medical History:  Diagnosis Date  . Hypercholesteremia   . Nephrolithiasis   . Sleep apnea     Surgical History: Past Surgical History:  Procedure Laterality Date  . HAND SURGERY  1999  . HERNIA REPAIR  1970's    Home Medications:  Allergies as of 09/22/2018   No Known Allergies     Medication List       Accurate as of September 22, 2018 11:59 PM. If you have any questions, ask your nurse or doctor.        aspirin EC 81 MG tablet Take 81 mg by mouth every evening.   sildenafil 100 MG tablet Commonly known as: VIAGRA Take 1 tablet (100 mg total) by mouth daily as needed for erectile dysfunction. Take two hours prior to intercourse on an empty stomach Started by: Michiel CowboySHANNON Nephi Savage, PA-C   tamsulosin 0.4 MG Caps capsule Commonly known as: FLOMAX Take  1 capsule (0.4 mg total) by mouth daily.       Allergies: No Known Allergies  Family History: Family History  Problem Relation Age of Onset  . Prostate cancer Father   . Hematuria Father     Social History:  reports that he has been smoking cigarettes. He has been smoking about 1.50 packs per day. He has never used smokeless tobacco. He reports that he does not drink alcohol or use drugs.  ROS: UROLOGY Frequent Urination?: No Hard to postpone urination?: No Burning/pain with urination?: No Get up at night to urinate?: No Leakage of urine?: No Urine stream starts and stops?: No Trouble starting stream?: Yes Do you have to strain to urinate?: No Blood in urine?: No Urinary tract infection?: No Sexually transmitted disease?: No Injury to kidneys or bladder?: No Painful intercourse?: No Weak stream?: No Erection problems?: Yes Penile pain?: No  Gastrointestinal Nausea?: No Vomiting?: No Indigestion/heartburn?: No Diarrhea?: No Constipation?: No  Constitutional Fever:  No Night sweats?: No Weight loss?: No Fatigue?: No  Skin Skin rash/lesions?: No Itching?: No  Eyes Blurred vision?: No Double vision?: No  Ears/Nose/Throat Sore throat?: No Sinus problems?: Yes  Hematologic/Lymphatic Swollen glands?: No Easy bruising?: No  Cardiovascular Leg swelling?: No Chest pain?: No  Respiratory Cough?: No Shortness of breath?: No  Endocrine Excessive thirst?: No  Musculoskeletal Back pain?: No Joint pain?: No  Neurological Headaches?: No Dizziness?: No  Psychologic Depression?: No Anxiety?: No  Physical Exam: BP (!) 149/80 (BP Location: Left Arm, Patient Position: Sitting, Cuff Size: Normal)   Pulse (!) 101   Ht 6\' 1"  (1.854 m)   Wt 274 lb (124.3 kg)   BMI 36.15 kg/m   Constitutional:  Well nourished. Alert and oriented, No acute distress. HEENT: [place] AT, moist mucus membranes.  Trachea midline, no masses. Cardiovascular: No clubbing, cyanosis, or edema. Respiratory: Normal respiratory effort, no increased work of breathing. Neurologic: Grossly intact, no focal deficits, moving all 4 extremities. Psychiatric: Normal mood and affect.  Laboratory Data: Component     Latest Ref Rng & Units 08/08/2018  Prostate Specific Ag, Serum     0.0 - 4.0 ng/mL 1.1   Lab Results  Component Value Date   WBC 15.2 (H) 10/02/2018   HGB 14.9 10/02/2018   HCT 45.1 10/02/2018   MCV 94.9 10/02/2018   PLT 286 10/02/2018    Lab Results  Component Value Date   CREATININE 0.76 10/02/2018    No results found for: PSA  No results found for: TESTOSTERONE  No results found for: HGBA1C  No results found for: TSH  No results found for: CHOL, HDL, CHOLHDL, VLDL, LDLCALC  Lab Results  Component Value Date   AST 18 12/12/2014   Lab Results  Component Value Date   ALT 21 12/12/2014   No components found for: ALKALINEPHOPHATASE No components found for: BILIRUBINTOTAL  No results found for: ESTRADIOL   I have reviewed the labs.    Pertinent Imaging: Results for TAAVI, HOOSE (MRN [id number]) as of 10/05/2018 13:14  Ref. Range 09/22/2018 15:09  Scan Result Unknown 8   Assessment & Plan:    1. BPH with LUTS IPSS score is 14/6, it is stable Continue conservative management, avoiding bladder irritants and timed voiding's Most bothersome symptoms is/are hesitancy Continue the tamsulosin 0.4 mg daily Schedule cystoscopy at this time to evaluate for BOO I have explained to the patient that they will  be scheduled for a cystoscopy in our office to evaluate their  bladder.  The cystoscopy consists of passing a tube with a lens up through their urethra and into their urinary bladder.   We will inject the urethra with a lidocaine gel prior to introducing the cystoscope to help with any discomfort during the procedure.   After the procedure, they might experience blood in the urine and discomfort with urination.  This will abate after the first few voids.  I have  encouraged the patient to increase water intake  during this time.  Patient denies any allergies to lidocaine.    2. History of nephrolithiasis Spontaneous passage of right ureteral stone and present of left renal stone in 2016 KUB 07/2018 large left renal calculus   Return for cystoscopy for BOO .  These notes generated with voice recognition software. I apologize for typographical errors.  Michiel CowboySHANNON Percilla Tweten, PA-C  [facility] [address]  [address] [place], [place] [address] [phone]

## 2018-09-22 ENCOUNTER — Other Ambulatory Visit: Payer: Self-pay

## 2018-09-22 ENCOUNTER — Encounter: Payer: Self-pay | Admitting: Urology

## 2018-09-22 ENCOUNTER — Ambulatory Visit (INDEPENDENT_AMBULATORY_CARE_PROVIDER_SITE_OTHER): Payer: No Typology Code available for payment source | Admitting: Urology

## 2018-09-22 VITALS — BP 149/80 | HR 101 | Ht 73.0 in | Wt 274.0 lb

## 2018-09-22 DIAGNOSIS — N138 Other obstructive and reflux uropathy: Secondary | ICD-10-CM | POA: Diagnosis not present

## 2018-09-22 DIAGNOSIS — N529 Male erectile dysfunction, unspecified: Secondary | ICD-10-CM | POA: Diagnosis not present

## 2018-09-22 DIAGNOSIS — N401 Enlarged prostate with lower urinary tract symptoms: Secondary | ICD-10-CM

## 2018-09-22 LAB — BLADDER SCAN AMB NON-IMAGING: Scan Result: 8

## 2018-09-22 MED ORDER — SILDENAFIL CITRATE 100 MG PO TABS: 100.0000 mg | ORAL_TABLET | Freq: Every day | ORAL | 3 refills | Status: DC | PRN

## 2018-10-02 ENCOUNTER — Other Ambulatory Visit: Payer: Self-pay

## 2018-10-02 ENCOUNTER — Emergency Department: Payer: PRIVATE HEALTH INSURANCE

## 2018-10-02 ENCOUNTER — Emergency Department
Admission: EM | Admit: 2018-10-02 | Discharge: 2018-10-02 | Disposition: A | Discharge status: Home / Self Care | Payer: PRIVATE HEALTH INSURANCE | Attending: Emergency Medicine | Admitting: Emergency Medicine

## 2018-10-02 ENCOUNTER — Encounter: Payer: Self-pay | Admitting: Intensive Care

## 2018-10-02 DIAGNOSIS — Z7982 Long term (current) use of aspirin: Secondary | ICD-10-CM | POA: Insufficient documentation

## 2018-10-02 DIAGNOSIS — N200 Calculus of kidney: Secondary | ICD-10-CM | POA: Diagnosis not present

## 2018-10-02 DIAGNOSIS — R112 Nausea with vomiting, unspecified: Secondary | ICD-10-CM | POA: Diagnosis not present

## 2018-10-02 DIAGNOSIS — F17210 Nicotine dependence, cigarettes, uncomplicated: Secondary | ICD-10-CM | POA: Insufficient documentation

## 2018-10-02 DIAGNOSIS — Z79899 Other long term (current) drug therapy: Secondary | ICD-10-CM | POA: Diagnosis not present

## 2018-10-02 DIAGNOSIS — R1032 Left lower quadrant pain: Secondary | ICD-10-CM | POA: Diagnosis present

## 2018-10-02 DIAGNOSIS — R310 Gross hematuria: Secondary | ICD-10-CM

## 2018-10-02 LAB — BASIC METABOLIC PANEL WITH GFR
Anion gap: 9 (ref 5–15)
BUN: 9 mg/dL (ref 6–20)
CO2: 24 mmol/L (ref 22–32)
Calcium: 8.5 mg/dL — ABNORMAL LOW (ref 8.9–10.3)
Chloride: 106 mmol/L (ref 98–111)
Creatinine, Ser: 0.76 mg/dL (ref 0.61–1.24)
GFR calc Af Amer: >60 mL/min
GFR calc non Af Amer: >60 mL/min
Glucose, Bld: 113 mg/dL — ABNORMAL HIGH (ref 70–99)
Potassium: 3.4 mmol/L — ABNORMAL LOW (ref 3.5–5.1)
Sodium: 139 mmol/L (ref 135–145)

## 2018-10-02 LAB — CBC
HCT: 45.1 % (ref 39.0–52.0)
Hemoglobin: 14.9 g/dL (ref 13.0–17.0)
MCH: 31.4 pg (ref 26.0–34.0)
MCHC: 33 g/dL (ref 30.0–36.0)
MCV: 94.9 fL (ref 80.0–100.0)
Platelets: 286 10*3/uL (ref 150–400)
RBC: 4.75 MIL/uL (ref 4.22–5.81)
RDW: 13.1 % (ref 11.5–15.5)
WBC: 15.2 10*3/uL — ABNORMAL HIGH (ref 4.0–10.5)
nRBC: 0 % (ref 0.0–0.2)

## 2018-10-02 LAB — URINALYSIS, COMPLETE (UACMP) WITH MICROSCOPIC
Bacteria, UA: NONE SEEN
Bilirubin Urine: NEGATIVE
Glucose, UA: NEGATIVE mg/dL
Ketones, ur: 5 mg/dL — AB
Leukocytes,Ua: NEGATIVE
Nitrite: NEGATIVE
Protein, ur: 30 mg/dL — AB
RBC / HPF: >50 RBC/hpf — ABNORMAL HIGH (ref 0–5)
Specific Gravity, Urine: 1.028 (ref 1.005–1.030)
pH: 5 (ref 5.0–8.0)

## 2018-10-02 MED ORDER — OXYCODONE-ACETAMINOPHEN 5-325 MG PO TABS
1.0000 | ORAL_TABLET | ORAL | Status: DC | PRN
  Administered 2018-10-02: 10:00:00 1 via ORAL
  Filled 2018-10-02: qty 1

## 2018-10-02 MED ORDER — KETOROLAC TROMETHAMINE 30 MG/ML IJ SOLN
15.0000 mg | Freq: Once | INTRAMUSCULAR | Status: AC
  Administered 2018-10-02: 15 mg via INTRAVENOUS
  Filled 2018-10-02: qty 1

## 2018-10-02 MED ORDER — ONDANSETRON 4 MG PO TBDP: 4.0000 mg | ORAL_TABLET | Freq: Three times a day (TID) | ORAL | 0 refills | Status: DC | PRN

## 2018-10-02 MED ORDER — IOHEXOL 300 MG/ML  SOLN
100.0000 mL | Freq: Once | INTRAMUSCULAR | Status: AC | PRN
  Administered 2018-10-02: 13:00:00 100 mL via INTRAVENOUS

## 2018-10-02 MED ORDER — ONDANSETRON HCL 4 MG/2ML IJ SOLN
4.0000 mg | Freq: Once | INTRAMUSCULAR | Status: AC
  Administered 2018-10-02: 4 mg via INTRAVENOUS
  Filled 2018-10-02: qty 2

## 2018-10-02 MED ORDER — OXYCODONE-ACETAMINOPHEN 5-325 MG PO TABS: 1.0000 | ORAL_TABLET | ORAL | 0 refills | Status: DC | PRN

## 2018-10-02 MED ORDER — LACTATED RINGERS IV BOLUS
1000.0000 mL | Freq: Once | INTRAVENOUS | Status: AC
  Administered 2018-10-02: 1000 mL via INTRAVENOUS

## 2018-10-02 NOTE — ED Provider Notes (Signed)
[facility] Emergency Department Provider Note   ____________________________________________   First MD Initiated Contact with Patient 10/02/18 1126     (approximate)  I have reviewed the triage vital signs and the nursing notes.   HISTORY  Chief Complaint Flank Pain (left)    HPI ANVITH MAURIELLO is a 58 y.o. male with past medical history of nephrolithiasis presents to the ED complaining of abdominal pain.  Patient reports acute onset of left lower quadrant abdominal pain yesterday that came on very suddenly.  Pain seemed to ease up but then recurred early this morning.  It was associated with nausea and one episode of nonbilious and nonbloody emesis.  He denies any fevers or dysuria, but has noticed dark-colored urine.  He reports a history of kidney stones on the right side and describes symptoms as similar.        Past Medical History:  Diagnosis Date  . Hypercholesteremia   . Nephrolithiasis   . Sleep apnea     Patient Active Problem List   Diagnosis Date Noted  . OSA on CPAP 09/11/2015  . Essential (primary) hypertension 02/02/2014  . Current tobacco use 02/02/2014    Past Surgical History:  Procedure Laterality Date  . HAND SURGERY  1999  . HERNIA REPAIR  1970's    Prior to Admission medications   Medication Sig Start Date End Date Taking? Authorizing Provider  aspirin EC 81 MG tablet Take 81 mg by mouth every evening.  11/24/13  Yes [provider]  tamsulosin (FLOMAX) 0.4 MG CAPS capsule Take 1 capsule (0.4 mg total) by mouth daily. 08/08/18  Yes McGowan, Larene Beach A, PA-C  ondansetron (ZOFRAN ODT) 4 MG disintegrating tablet Take 1 tablet (4 mg total) by mouth every 8 (eight) hours as needed for nausea or vomiting. 10/02/18   Blake Divine, MD  oxyCODONE-acetaminophen (PERCOCET) 5-325 MG tablet Take 1 tablet by mouth every 4 (four) hours as needed for severe pain. 10/02/18 10/02/19  Blake Divine, MD    Allergies  Patient has no known allergies.  Family History  Problem Relation Age of Onset  . Prostate cancer Father   . Hematuria Father     Social History Social History   Tobacco Use  . Smoking status: Current Every Day Smoker    Packs/day: 1.50    Types: Cigarettes  . Smokeless tobacco: Never Used  Substance Use Topics  . Alcohol use: No  . Drug use: Never    Review of Systems  Constitutional: No fever/chills Eyes: No visual changes. ENT: No sore throat. Cardiovascular: Denies chest pain. Respiratory: Denies shortness of breath. Gastrointestinal: Positive for abdominal pain.  Positive for nausea and vomiting.  No diarrhea.  No constipation. Genitourinary: Negative for dysuria.  Positive for hematuria. Musculoskeletal: Negative for back pain. Skin: Negative for rash. Neurological: Negative for headaches, focal weakness or numbness.  ____________________________________________   PHYSICAL EXAM:  VITAL SIGNS: ED Triage Vitals [10/02/18 0958]  Enc Vitals Group     BP (!) 142/98     Pulse Rate 85     Resp 20     Temp 98.2 F (36.8 C)     Temp Source Oral     SpO2 96 %     Weight 275 lb (124.7 kg)     Height 6\' 1"  (1.854 m)     Head Circumference      Peak Flow      Pain Score 6     Pain Loc  Pain Edu?      Excl. in GC?    Constitutional: Alert and oriented. Eyes: Conjunctivae are normal. Head: Atraumatic. Nose: No congestion/rhinnorhea. Mouth/Throat: Mucous membranes are moist. Neck: Normal ROM Cardiovascular: Normal rate, regular rhythm. Grossly normal heart sounds. Respiratory: Normal respiratory effort.  No retractions. Lungs CTAB. Gastrointestinal: Soft and tender to palpation in the left lower quadrant. No distention. Genitourinary: deferred Musculoskeletal: No lower extremity tenderness nor edema. Neurologic:  Normal speech and language. No gross focal neurologic deficits are appreciated. Skin:  Skin is warm, dry and intact. No rash noted.  Psychiatric: Mood and affect are normal. Speech and behavior are normal.  ____________________________________________   LABS (all labs ordered are listed, but only abnormal results are displayed)  Labs Reviewed  URINALYSIS, COMPLETE (UACMP) WITH MICROSCOPIC - Abnormal; Notable for the following components:      Result Value   Color, Urine AMBER (*)    APPearance CLOUDY (*)    Hgb urine dipstick LARGE (*)    Ketones, ur 5 (*)    Protein, ur 30 (*)    RBC / HPF >50 (*)    All other components within normal limits  BASIC METABOLIC PANEL - Abnormal; Notable for the following components:   Potassium 3.4 (*)    Glucose, Bld 113 (*)    Calcium 8.5 (*)    All other components within normal limits  CBC - Abnormal; Notable for the following components:   WBC 15.2 (*)    All other components within normal limits     PROCEDURES  Procedure(s) performed (including Critical Care):  Procedures   ____________________________________________   INITIAL IMPRESSION / ASSESSMENT AND PLAN / ED COURSE       58 year old male with history of kidney stones presents to the ED with acute onset left lower quadrant abdominal pain.  Appears consistent with nephrolithiasis but will also need to assess for diverticulitis given his left lower quadrant tenderness.  Will hydrate and medicate for pain and nausea.  Labs unremarkable, UA not consistent with infection.  CT does show large 1 cm stone at the left UPJ.  Case discussed with Dr. Annabell HowellsWrenn of urology, who agrees patient appropriate for outpatient follow-up given symptoms under control and no evidence of infection.  Patient given follow-up information for urology clinic, counseled to return to the ED for new or worsening symptoms.  Patient agrees with plan.      ____________________________________________   FINAL CLINICAL IMPRESSION(S) / ED DIAGNOSES  Final diagnoses:  Kidney stone  Gross hematuria     ED Discharge Orders         Ordered     oxyCODONE-acetaminophen (PERCOCET) 5-325 MG tablet  Every 4 hours PRN     10/02/18 1358    ondansetron (ZOFRAN ODT) 4 MG disintegrating tablet  Every 8 hours PRN     10/02/18 1358           Note:  This document was prepared using Dragon voice recognition software and may include unintentional dictation errors.   Chesley NoonJessup, Damir Leung, MD 10/02/18 1623

## 2018-10-02 NOTE — ED Notes (Addendum)
Pt states he has a hx of kidney stones five years ago- pt states he has left sided flank pain that he believes to be connect to the same kidney stone as 5 years ago  Pt aware of need for urine sample

## 2018-10-02 NOTE — ED Triage Notes (Signed)
PAtient c/o left flank pain that started this AM. Patient is drenched in sweat

## 2018-10-02 NOTE — ED Notes (Signed)
First Nurse Note: Pt to ED c/o left flank pain. Pt is in NAD

## 2018-10-04 ENCOUNTER — Other Ambulatory Visit: Payer: Self-pay

## 2018-10-04 ENCOUNTER — Encounter: Payer: Self-pay | Admitting: Urology

## 2018-10-04 ENCOUNTER — Ambulatory Visit (INDEPENDENT_AMBULATORY_CARE_PROVIDER_SITE_OTHER): Payer: No Typology Code available for payment source | Admitting: Urology

## 2018-10-04 ENCOUNTER — Other Ambulatory Visit
Admission: RE | Admit: 2018-10-04 | Discharge: 2018-10-04 | Disposition: A | Discharge status: Home / Self Care | Payer: PRIVATE HEALTH INSURANCE | Attending: Urology | Admitting: Urology

## 2018-10-04 VITALS — BP 130/88 | HR 76 | Ht 73.0 in | Wt 275.0 lb

## 2018-10-04 DIAGNOSIS — Z87442 Personal history of urinary calculi: Secondary | ICD-10-CM | POA: Insufficient documentation

## 2018-10-04 DIAGNOSIS — N138 Other obstructive and reflux uropathy: Secondary | ICD-10-CM

## 2018-10-04 DIAGNOSIS — N401 Enlarged prostate with lower urinary tract symptoms: Secondary | ICD-10-CM | POA: Diagnosis present

## 2018-10-04 DIAGNOSIS — N200 Calculus of kidney: Secondary | ICD-10-CM | POA: Insufficient documentation

## 2018-10-04 LAB — URINALYSIS, COMPLETE (UACMP) WITH MICROSCOPIC
Bacteria, UA: NONE SEEN
Bilirubin Urine: NEGATIVE
Glucose, UA: NEGATIVE mg/dL
Leukocytes,Ua: NEGATIVE
Nitrite: NEGATIVE
Protein, ur: NEGATIVE mg/dL
Specific Gravity, Urine: >1.03 — ABNORMAL HIGH (ref 1.005–1.030)
Squamous Epithelial / HPF: NONE SEEN (ref 0–5)
pH: 5 (ref 5.0–8.0)

## 2018-10-04 NOTE — H&P (View-Only) (Signed)
 10/04/18 12:00 PM   Sean Sosa 09/21/1960 [id number]  Referring provider: Boswell, Chelsa H, NP [address] [place],  [place] [address]  CC: Left UPJ stone  HPI: I saw Mr. Sean Sosa in urology clinic today for ER follow-up.  He is typically followed by Shannon McGowan, PA in our clinic for BPH and is on Flomax.  He has a history of a spontaneously passed right sided calcium oxalate stone in 2016.  He presented to the emergency department on 10/02/2018 with a few days of worsening left-sided flank pain, and CT demonstrated a 1 cm left UPJ stone, 850HU, 12cm SSD, clearly seen on scout CT. He denies any fevers or chills.  Urinalysis showed greater than 50 RBCs, 6-10 WBCs, no bacteria, and was nitrite negative.  There are no aggravating factors.  His pain is currently moderately to well controlled with narcotics.  He denies any dysuria.  He denies any other significant medical problems.  He denies any fevers, chills, chest pain, or shortness of breath.  He is interested in considering outlet procedures in the future for his BPH that is only moderately improved on Flomax.  Prostate measures 58 g on recent CT.   PMH: Past Medical History:  Diagnosis Date  . Hypercholesteremia   . Nephrolithiasis   . Sleep apnea     Surgical History: Past Surgical History:  Procedure Laterality Date  . HAND SURGERY  1999  . HERNIA REPAIR  1970's    Allergies: No Known Allergies  Family History: Family History  Problem Relation Age of Onset  . Prostate cancer Father   . Hematuria Father     Social History:  reports that he has been smoking cigarettes. He has been smoking about 1.50 packs per day. He has never used smokeless tobacco. He reports that he does not drink alcohol or use drugs.  ROS: Please see flowsheet from today's date for complete review of systems.  Physical Exam: BP 130/88   Pulse 76   Ht 6' 1" (1.854 m)   Wt 275 lb (124.7 kg)   BMI 36.28 kg/m     Constitutional:  Alert and oriented, No acute distress. Cardiovascular: No clubbing, cyanosis, or edema. Respiratory: Normal respiratory effort, no increased work of breathing. GI: Abdomen is soft, nontender, nondistended, no abdominal masses GU: Left CVA tenderness Lymph: No cervical or inguinal lymphadenopathy. Skin: No rashes, bruises or suspicious lesions. Neurologic: Grossly intact, no focal deficits, moving all 4 extremities. Psychiatric: Normal mood and affect.  Laboratory Data: Reviewed Urinalysis today 11-20 RBCs, 0-5 WBCs, nitrite negative, no bacteria PSA 1.1 07/2018  Pertinent Imaging: I have personally reviewed the CT. 1cm left UPJ stone, 850HU, 12cm SSD, clearly seen on scout CT. Prostate measures 58g.   Assessment & Plan:   In summary, the patient is a relatively healthy 58-year-old male with moderately controlled BPH on Flomax, 58 g prostate on CT, and new left-sided flank pain secondary to a 1 cm left UPJ stone.  We discussed various treatment options for urolithiasis including observation with or without medical expulsive therapy, shockwave lithotripsy (SWL), ureteroscopy and laser lithotripsy with stent placement, and percutaneous nephrolithotomy.  We discussed that management is based on stone size, location, density, patient co-morbidities, and patient preference.   Stones <5mm in size have a >80% spontaneous passage rate. Data surrounding the use of tamsulosin for medical expulsive therapy is controversial, but meta analyses suggests it is most efficacious for distal stones between 5-10mm in size. Possible side effects include dizziness/lightheadedness, and   retrograde ejaculation.  SWL has a lower stone free rate in a single procedure, but also a lower complication rate compared to ureteroscopy and avoids a stent and associated stent related symptoms. Possible complications include renal hematoma, steinstrasse, and need for additional treatment.  Ureteroscopy with  laser lithotripsy and stent placement has a higher stone free rate than SWL in a single procedure, however increased complication rate including possible infection, ureteral injury, bleeding, and stent related morbidity. Common stent related symptoms include dysuria, urgency/frequency, and flank pain.  After an extensive discussion of the risks and benefits of the above treatment options, the patient would like to proceed with left shockwave lithotripsy.  We will discuss outlet procedures like HOLEP at follow-up KUB after SWL  Sean Madrid, MD 10/04/2018  A total of 25 minutes were spent face-to-face with the patient, greater than 50% was spent in patient education, counseling, and coordination of care regarding BPH, nephrolithiasis, treatment options, and shockwave lithotripsy.   [facility] [address] [place], [place] [address] [phone]

## 2018-10-04 NOTE — Progress Notes (Signed)
10/04/18 12:00 PM   Sean Sosa 10-25-1960 [id number]  Referring provider: Fayrene HelperBoswell, Chelsa H, NP [address] [place],  [place] [id number]  CC: Left UPJ stone  HPI: I saw Mr. Sean Sosa in urology clinic today for ER follow-up.  He is typically followed by Michiel CowboyShannon McGowan, PA in our clinic for BPH and is on Flomax.  He has a history of a spontaneously passed right sided calcium oxalate stone in 2016.  He presented to the emergency department on 10/02/2018 with a few days of worsening left-sided flank pain, and CT demonstrated a 1 cm left UPJ stone, 850HU, 12cm SSD, clearly seen on scout CT. He denies any fevers or chills.  Urinalysis showed greater than 50 RBCs, 6-10 WBCs, no bacteria, and was nitrite negative.  There are no aggravating factors.  His pain is currently moderately to well controlled with narcotics.  He denies any dysuria.  He denies any other significant medical problems.  He denies any fevers, chills, chest pain, or shortness of breath.  He is interested in considering outlet procedures in the future for his BPH that is only moderately improved on Flomax.  Prostate measures 58 g on recent CT.   PMH: Past Medical History:  Diagnosis Date  . Hypercholesteremia   . Nephrolithiasis   . Sleep apnea     Surgical History: Past Surgical History:  Procedure Laterality Date  . HAND SURGERY  1999  . HERNIA REPAIR  1970's    Allergies: No Known Allergies  Family History: Family History  Problem Relation Age of Onset  . Prostate cancer Father   . Hematuria Father     Social History:  reports that he has been smoking cigarettes. He has been smoking about 1.50 packs per day. He has never used smokeless tobacco. He reports that he does not drink alcohol or use drugs.  ROS: Please see flowsheet from today's date for complete review of systems.  Physical Exam: BP 130/88   Pulse 76   Ht 6\' 1"  (1.854 m)   Wt 275 lb (124.7 kg)   BMI 36.28 kg/m     Constitutional:  Alert and oriented, No acute distress. Cardiovascular: No clubbing, cyanosis, or edema. Respiratory: Normal respiratory effort, no increased work of breathing. GI: Abdomen is soft, nontender, nondistended, no abdominal masses GU: Left CVA tenderness Lymph: No cervical or inguinal lymphadenopathy. Skin: No rashes, bruises or suspicious lesions. Neurologic: Grossly intact, no focal deficits, moving all 4 extremities. Psychiatric: Normal mood and affect.  Laboratory Data: Reviewed Urinalysis today 11-20 RBCs, 0-5 WBCs, nitrite negative, no bacteria PSA 1.1 07/2018  Pertinent Imaging: I have personally reviewed the CT. 1cm left UPJ stone, 850HU, 12cm SSD, clearly seen on scout CT. Prostate measures 58g.   Assessment & Plan:   In summary, the patient is a relatively healthy 58 year old male with moderately controlled BPH on Flomax, 58 g prostate on CT, and new left-sided flank pain secondary to a 1 cm left UPJ stone.  We discussed various treatment options for urolithiasis including observation with or without medical expulsive therapy, shockwave lithotripsy (SWL), ureteroscopy and laser lithotripsy with stent placement, and percutaneous nephrolithotomy.  We discussed that management is based on stone size, location, density, patient co-morbidities, and patient preference.   Stones <165mm in size have a >80% spontaneous passage rate. Data surrounding the use of tamsulosin for medical expulsive therapy is controversial, but meta analyses suggests it is most efficacious for distal stones between 5-5959mm in size. Possible side effects include dizziness/lightheadedness, and  retrograde ejaculation.  SWL has a lower stone free rate in a single procedure, but also a lower complication rate compared to ureteroscopy and avoids a stent and associated stent related symptoms. Possible complications include renal hematoma, steinstrasse, and need for additional treatment.  Ureteroscopy with  laser lithotripsy and stent placement has a higher stone free rate than SWL in a single procedure, however increased complication rate including possible infection, ureteral injury, bleeding, and stent related morbidity. Common stent related symptoms include dysuria, urgency/frequency, and flank pain.  After an extensive discussion of the risks and benefits of the above treatment options, the patient would like to proceed with left shockwave lithotripsy.  We will discuss outlet procedures like HOLEP at follow-up KUB after SWL  Sean Madrid, MD 10/04/2018  A total of 25 minutes were spent face-to-face with the patient, greater than 50% was spent in patient education, counseling, and coordination of care regarding BPH, nephrolithiasis, treatment options, and shockwave lithotripsy.   [facility] [address] [place], [place] [address] [phone]

## 2018-10-04 NOTE — Patient Instructions (Signed)

## 2018-10-05 ENCOUNTER — Other Ambulatory Visit
Admission: RE | Admit: 2018-10-05 | Discharge: 2018-10-05 | Disposition: A | Discharge status: Home / Self Care | Payer: PRIVATE HEALTH INSURANCE | Source: Ambulatory Visit | Attending: Urology | Admitting: Urology

## 2018-10-05 ENCOUNTER — Telehealth: Payer: Self-pay

## 2018-10-05 DIAGNOSIS — Z20828 Contact with and (suspected) exposure to other viral communicable diseases: Secondary | ICD-10-CM | POA: Insufficient documentation

## 2018-10-05 DIAGNOSIS — Z01812 Encounter for preprocedural laboratory examination: Secondary | ICD-10-CM | POA: Insufficient documentation

## 2018-10-05 LAB — SARS CORONAVIRUS 2 (TAT 6-24 HRS): SARS Coronavirus 2: NEGATIVE

## 2018-10-05 LAB — URINE CULTURE: Culture: NO GROWTH

## 2018-10-05 NOTE — Telephone Encounter (Signed)
Spoke with patient and notified him that he would need to go for Covid testing this morning before 11am and to [facility]. Patient verbalized understanding

## 2018-10-06 ENCOUNTER — Encounter: Payer: Self-pay | Admitting: *Deleted

## 2018-10-06 ENCOUNTER — Ambulatory Visit: Payer: PRIVATE HEALTH INSURANCE

## 2018-10-06 ENCOUNTER — Encounter
Admission: RE | Disposition: A | Discharge status: Home / Self Care | Payer: Self-pay | Source: Home / Self Care | Attending: Urology

## 2018-10-06 ENCOUNTER — Encounter: Payer: Self-pay | Admitting: Anesthesiology

## 2018-10-06 ENCOUNTER — Other Ambulatory Visit: Payer: Self-pay

## 2018-10-06 ENCOUNTER — Ambulatory Visit
Admission: RE | Admit: 2018-10-06 | Discharge: 2018-10-06 | Disposition: A | Discharge status: Home / Self Care | Payer: PRIVATE HEALTH INSURANCE | Attending: Urology | Admitting: Urology

## 2018-10-06 DIAGNOSIS — Z842 Family history of other diseases of the genitourinary system: Secondary | ICD-10-CM | POA: Insufficient documentation

## 2018-10-06 DIAGNOSIS — N400 Enlarged prostate without lower urinary tract symptoms: Secondary | ICD-10-CM | POA: Diagnosis not present

## 2018-10-06 DIAGNOSIS — G4730 Sleep apnea, unspecified: Secondary | ICD-10-CM | POA: Diagnosis not present

## 2018-10-06 DIAGNOSIS — F17210 Nicotine dependence, cigarettes, uncomplicated: Secondary | ICD-10-CM | POA: Diagnosis not present

## 2018-10-06 DIAGNOSIS — Z8042 Family history of malignant neoplasm of prostate: Secondary | ICD-10-CM | POA: Diagnosis not present

## 2018-10-06 DIAGNOSIS — N200 Calculus of kidney: Secondary | ICD-10-CM | POA: Diagnosis present

## 2018-10-06 DIAGNOSIS — E7800 Pure hypercholesterolemia, unspecified: Secondary | ICD-10-CM | POA: Diagnosis not present

## 2018-10-06 HISTORY — PX: EXTRACORPOREAL SHOCK WAVE LITHOTRIPSY: SHX1557

## 2018-10-06 SURGERY — LITHOTRIPSY, ESWL
Anesthesia: Moderate Sedation | Laterality: Left

## 2018-10-06 MED ORDER — ONDANSETRON HCL 4 MG/2ML IJ SOLN
INTRAMUSCULAR | Status: AC
  Filled 2018-10-06: qty 2

## 2018-10-06 MED ORDER — DIAZEPAM 5 MG PO TABS: 10.0000 mg | ORAL_TABLET | Freq: Once | ORAL | Status: DC

## 2018-10-06 MED ORDER — OXYCODONE-ACETAMINOPHEN 5-325 MG PO TABS
ORAL_TABLET | ORAL | Status: AC
  Filled 2018-10-06: qty 1

## 2018-10-06 MED ORDER — DIAZEPAM 5 MG PO TABS
ORAL_TABLET | ORAL | Status: AC
  Administered 2018-10-06: 10 mg
  Filled 2018-10-06: qty 2

## 2018-10-06 MED ORDER — ONDANSETRON HCL 4 MG/2ML IJ SOLN
4.0000 mg | Freq: Once | INTRAMUSCULAR | Status: AC
  Administered 2018-10-06: 4 mg via INTRAVENOUS

## 2018-10-06 MED ORDER — CIPROFLOXACIN HCL 500 MG PO TABS
ORAL_TABLET | ORAL | Status: AC
  Filled 2018-10-06: qty 1

## 2018-10-06 MED ORDER — DIPHENHYDRAMINE HCL 25 MG PO CAPS
25.0000 mg | ORAL_CAPSULE | Freq: Once | ORAL | Status: AC
  Administered 2018-10-06: 07:00:00 25 mg via ORAL

## 2018-10-06 MED ORDER — OXYCODONE-ACETAMINOPHEN 5-325 MG PO TABS
1.0000 | ORAL_TABLET | Freq: Once | ORAL | Status: AC
  Administered 2018-10-06: 1 via ORAL

## 2018-10-06 MED ORDER — SODIUM CHLORIDE 0.9 % IV SOLN
Freq: Once | INTRAVENOUS | Status: AC
  Administered 2018-10-06: 07:00:00 via INTRAVENOUS

## 2018-10-06 MED ORDER — DIPHENHYDRAMINE HCL 25 MG PO CAPS
ORAL_CAPSULE | ORAL | Status: AC
  Filled 2018-10-06: qty 1

## 2018-10-06 MED ORDER — CIPROFLOXACIN HCL 500 MG PO TABS
500.0000 mg | ORAL_TABLET | Freq: Once | ORAL | Status: AC
  Administered 2018-10-06: 500 mg via ORAL

## 2018-10-06 NOTE — Discharge Instructions (Signed)
Laser Therapy for Kidney Stones Laser therapy for kidney stones is a procedure to break up small, hard mineral deposits that form in the kidney (kidney stones). The procedure is done using a device that produces a focused beam of light (laser). The laser breaks up kidney stones into pieces that are small enough to be passed out of the body through urination or removed from the body during the procedure. You may need laser therapy if you have kidney stones that are painful or block your urinary tract. This procedure is done by inserting a tube (ureteroscope) into your kidney through the urethral opening. The urethra is the part of the body that drains urine from the bladder. In women, the urethra opens above the vaginal opening. In men, the urethra opens at the tip of the penis. The ureteroscope is inserted through the urethra, and surgical instruments are moved through the bladder and the muscular tube that connects the kidney to the bladder (ureter) until they reach the kidney. Tell a health care provider about:  Any allergies you have.  All medicines you are taking, including vitamins, herbs, eye drops, creams, and over-the-counter medicines.  Any problems you or family members have had with anesthetic medicines.  Any blood disorders you have.  Any surgeries you have had.  Any medical conditions you have.  Whether you are pregnant or may be pregnant. What are the risks? Generally, this is a safe procedure. However, problems may occur, including:  Infection.  Bleeding.  Allergic reactions to medicines.  Damage to the urethra, bladder, or ureter.  Urinary tract infection (UTI).  Narrowing of the urethra (urethral stricture).  Difficulty passing urine.  Blockage of the kidney caused by a fragment of kidney stone. What happens before the procedure? Medicines  Ask your health care provider about: ? Changing or stopping your regular medicines. This is especially important if  you are taking diabetes medicines or blood thinners. ? Taking medicines such as aspirin and ibuprofen. These medicines can thin your blood. Do not take these medicines unless your health care provider tells you to take them. ? Taking over-the-counter medicines, vitamins, herbs, and supplements. Eating and drinking Follow instructions from your health care provider about eating and drinking, which may include:  8 hours before the procedure - stop eating heavy meals or foods, such as meat, fried foods, or fatty foods.  6 hours before the procedure - stop eating light meals or foods, such as toast or cereal.  6 hours before the procedure - stop drinking milk or drinks that contain milk.  2 hours before the procedure - stop drinking clear liquids. Staying hydrated Follow instructions from your health care provider about hydration, which may include:  Up to 2 hours before the procedure - you may continue to drink clear liquids, such as water, clear fruit juice, black coffee, and plain tea.  General instructions  You may have a physical exam before the procedure. You may also have tests, such as imaging tests and blood or urine tests.  If your ureter is too narrow, your health care provider may place a soft, flexible tube (stent) inside of it. The stent may be placed days or weeks before your laser therapy procedure.  Plan to have someone take you home from the hospital or clinic.  If you will be going home right after the procedure, plan to have someone stay with you for 24 hours.  Do not use any products that contain nicotine or tobacco for at least  4 weeks before the procedure. These products include cigarettes, e-cigarettes, and chewing tobacco. If you need help quitting, ask your health care provider.  Ask your health care provider: ? How your surgical site will be marked or identified. ? What steps will be taken to help prevent infection. These may include:  Removing hair at the  surgery site.  Washing skin with a germ-killing soap.  Taking antibiotic medicine. What happens during the procedure?   An IV will be inserted into one of your veins.  You will be given one or more of the following: ? A medicine to help you relax (sedative). ? A medicine to numb the area (local anesthetic). ? A medicine to make you fall asleep (general anesthetic).  A ureteroscope will be inserted into your urethra. The ureteroscope will send images to a video screen in the operating room to guide your surgeon to the area of your kidney that will be treated.  A small, flexible tube will be threaded through the ureteroscope and into your bladder and ureter, up to your kidney.  The laser device will be inserted into your kidney through the tube. Your surgeon will pulse the laser on and off to break up kidney stones.  A surgical instrument that has a tiny wire basket may be inserted through the tube into your kidney to remove the pieces of broken kidney stone. The procedure may vary among health care providers and hospitals. What happens after the procedure?  Your blood pressure, heart rate, breathing rate, and blood oxygen level will be monitored until you leave the hospital or clinic.  You will be given pain medicine as needed.  You may continue to receive antibiotics.  You may have a stent temporarily placed in your ureter.  Do not drive for 24 hours if you were given a sedative during your procedure.  You may be given a strainer to collect any stone fragments that you pass in your urine. Your health care provider may have these tested. Summary  Laser therapy for kidney stones is a procedure to break up kidney stones into pieces that are small enough to be passed out of the body through urination or removed during the procedure.  Follow instructions from your health care provider about eating and drinking before the procedure.  During the procedure, the ureteroscope will  send images to a video screen to guide your surgeon to the area of your kidney that will be treated.  Do not drive for 24 hours if you were given a sedative during your procedure. This information is not intended to replace advice given to you by your health care provider. Make sure you discuss any questions you have with your health care provider. Document Released: 03/01/2015 Document Revised: 10/14/2017 Document Reviewed: 10/14/2017 Elsevier Patient Education  2020 [employer] As per [facility] instructions

## 2018-10-06 NOTE — Interval H&P Note (Signed)
History and Physical Interval Note:  10/06/2018 8:52 AM  Sean Sosa  has presented today for surgery, with the diagnosis of kidney stone..  The various methods of treatment have been discussed with the patient and family. After consideration of risks, benefits and other options for treatment, the patient has consented to  Procedure(s): EXTRACORPOREAL SHOCK WAVE LITHOTRIPSY (ESWL) (Left) as a surgical intervention.  The patient's history has been reviewed, patient examined, no change in status, stable for surgery.  I have reviewed the patient's chart and labs.  Questions were answered to the patient's satisfaction.     [place]

## 2018-10-07 ENCOUNTER — Telehealth: Payer: Self-pay | Admitting: Urology

## 2018-10-07 MED ORDER — ONDANSETRON 4 MG PO TBDP: 4.0000 mg | ORAL_TABLET | Freq: Three times a day (TID) | ORAL | 0 refills | Status: DC | PRN

## 2018-10-07 MED ORDER — OXYCODONE-ACETAMINOPHEN 5-325 MG PO TABS: 1.0000 | ORAL_TABLET | ORAL | 0 refills | Status: DC | PRN

## 2018-10-07 NOTE — Telephone Encounter (Signed)
Pt had surgery yesterday.  He was up all night w/nausea and hurting really bad.  Pt needs pain meds and something for nausea.  He uses [facility] on [address].

## 2018-10-07 NOTE — Telephone Encounter (Signed)
Pt called back and I read message from [place] °

## 2018-10-07 NOTE — Telephone Encounter (Signed)
Prescriptions were sent.  If he has uncontrolled pain or develops fever would recommend an ED visit.

## 2018-10-11 ENCOUNTER — Emergency Department: Payer: PRIVATE HEALTH INSURANCE

## 2018-10-11 ENCOUNTER — Encounter: Payer: Self-pay | Admitting: Emergency Medicine

## 2018-10-11 ENCOUNTER — Emergency Department
Admission: EM | Admit: 2018-10-11 | Discharge: 2018-10-11 | Disposition: A | Discharge status: Home / Self Care | Payer: PRIVATE HEALTH INSURANCE | Attending: Emergency Medicine | Admitting: Emergency Medicine

## 2018-10-11 ENCOUNTER — Other Ambulatory Visit: Payer: Self-pay

## 2018-10-11 ENCOUNTER — Other Ambulatory Visit
Admit: 2018-10-11 | Discharge: 2018-10-11 | Disposition: A | Discharge status: Home / Self Care | Payer: PRIVATE HEALTH INSURANCE | Attending: Urology | Admitting: Urology

## 2018-10-11 ENCOUNTER — Telehealth: Payer: Self-pay | Admitting: Urology

## 2018-10-11 DIAGNOSIS — N201 Calculus of ureter: Secondary | ICD-10-CM | POA: Diagnosis not present

## 2018-10-11 DIAGNOSIS — Z20828 Contact with and (suspected) exposure to other viral communicable diseases: Secondary | ICD-10-CM | POA: Insufficient documentation

## 2018-10-11 DIAGNOSIS — R3129 Other microscopic hematuria: Secondary | ICD-10-CM | POA: Diagnosis not present

## 2018-10-11 DIAGNOSIS — Z9889 Other specified postprocedural states: Secondary | ICD-10-CM | POA: Diagnosis not present

## 2018-10-11 DIAGNOSIS — Z7982 Long term (current) use of aspirin: Secondary | ICD-10-CM | POA: Insufficient documentation

## 2018-10-11 DIAGNOSIS — R1032 Left lower quadrant pain: Secondary | ICD-10-CM | POA: Diagnosis present

## 2018-10-11 DIAGNOSIS — F17210 Nicotine dependence, cigarettes, uncomplicated: Secondary | ICD-10-CM | POA: Diagnosis not present

## 2018-10-11 DIAGNOSIS — I10 Essential (primary) hypertension: Secondary | ICD-10-CM | POA: Insufficient documentation

## 2018-10-11 LAB — BASIC METABOLIC PANEL
Anion gap: 12 (ref 5–15)
BUN: 16 mg/dL (ref 6–20)
CO2: 24 mmol/L (ref 22–32)
Calcium: 8.8 mg/dL — ABNORMAL LOW (ref 8.9–10.3)
Chloride: 100 mmol/L (ref 98–111)
Creatinine, Ser: 1.27 mg/dL — ABNORMAL HIGH (ref 0.61–1.24)
GFR calc Af Amer: >60 mL/min (ref >60)
GFR calc non Af Amer: >60 mL/min (ref >60)
Glucose, Bld: 131 mg/dL — ABNORMAL HIGH (ref 70–99)
Potassium: 3.4 mmol/L — ABNORMAL LOW (ref 3.5–5.1)
Sodium: 136 mmol/L (ref 135–145)

## 2018-10-11 LAB — CBC WITH DIFFERENTIAL/PLATELET
Abs Immature Granulocytes: 0.1 10*3/uL — ABNORMAL HIGH (ref 0.00–0.07)
Basophils Absolute: 0 10*3/uL (ref 0.0–0.1)
Basophils Relative: 0 %
Eosinophils Absolute: 0.1 10*3/uL (ref 0.0–0.5)
Eosinophils Relative: 1 %
HCT: 43.9 % (ref 39.0–52.0)
Hemoglobin: 14.9 g/dL (ref 13.0–17.0)
Immature Granulocytes: 1 %
Lymphocytes Relative: 25 %
Lymphs Abs: 4.2 10*3/uL — ABNORMAL HIGH (ref 0.7–4.0)
MCH: 31.3 pg (ref 26.0–34.0)
MCHC: 33.9 g/dL (ref 30.0–36.0)
MCV: 92.2 fL (ref 80.0–100.0)
Monocytes Absolute: 1.3 10*3/uL — ABNORMAL HIGH (ref 0.1–1.0)
Monocytes Relative: 8 %
Neutro Abs: 11.1 10*3/uL — ABNORMAL HIGH (ref 1.7–7.7)
Neutrophils Relative %: 65 %
Platelets: 296 10*3/uL (ref 150–400)
RBC: 4.76 MIL/uL (ref 4.22–5.81)
RDW: 12.8 % (ref 11.5–15.5)
WBC: 16.8 10*3/uL — ABNORMAL HIGH (ref 4.0–10.5)
nRBC: 0 % (ref 0.0–0.2)

## 2018-10-11 LAB — URINALYSIS, COMPLETE (UACMP) WITH MICROSCOPIC
Bacteria, UA: NONE SEEN
Bilirubin Urine: NEGATIVE
Glucose, UA: NEGATIVE mg/dL
Ketones, ur: 5 mg/dL — AB
Leukocytes,Ua: NEGATIVE
Nitrite: NEGATIVE
Protein, ur: NEGATIVE mg/dL
Specific Gravity, Urine: 1.027 (ref 1.005–1.030)
pH: 5 (ref 5.0–8.0)

## 2018-10-11 LAB — SARS CORONAVIRUS 2 (TAT 6-24 HRS): SARS Coronavirus 2: NEGATIVE

## 2018-10-11 MED ORDER — FENTANYL CITRATE (PF) 100 MCG/2ML IJ SOLN
50.0000 ug | Freq: Once | INTRAMUSCULAR | Status: AC
  Administered 2018-10-11: 05:00:00 50 ug via INTRAVENOUS
  Filled 2018-10-11: qty 2

## 2018-10-11 MED ORDER — HYDROMORPHONE HCL 1 MG/ML IJ SOLN
1.0000 mg | Freq: Once | INTRAMUSCULAR | Status: AC
  Administered 2018-10-11: 1 mg via INTRAVENOUS
  Filled 2018-10-11: qty 1

## 2018-10-11 MED ORDER — SODIUM CHLORIDE 0.9 % IV BOLUS
1000.0000 mL | Freq: Once | INTRAVENOUS | Status: AC
  Administered 2018-10-11: 05:00:00 1000 mL via INTRAVENOUS

## 2018-10-11 MED ORDER — CEFAZOLIN SODIUM-DEXTROSE 2-4 GM/100ML-% IV SOLN
2.0000 g | Freq: Once | INTRAVENOUS | Status: AC
  Administered 2018-10-12: 2 g via INTRAVENOUS

## 2018-10-11 MED ORDER — OXYCODONE-ACETAMINOPHEN 5-325 MG PO TABS: 1.0000 | ORAL_TABLET | ORAL | 0 refills | Status: DC | PRN

## 2018-10-11 NOTE — ED Provider Notes (Signed)
-----------------------------------------   7:06 AM on 10/11/2018 -----------------------------------------  Blood pressure (!) 141/88, pulse 81, temperature 98.3 F (36.8 C), temperature source Oral, resp. rate 20, height 6\' 1"  (1.854 m), weight 122.5 kg, SpO2 93 %.  Assuming care from Dr. Joni Fears.  In short, Sean Sosa is a 58 y.o. male with a chief complaint of Flank Pain .  Refer to the original H&P for additional details.  The current plan of care is to await evaluation by urology, patient s/p recent lithotripsy for 63mm nephrolith.  Patient evaluated by urology, plan for ureteroscopy as an outpatient, potentially tomorrow.  Patient was able to schedule this for tomorrow while he remained in the ED.  Pain well controlled at this time, will provide additional pain medication for use at home.  Counseled patient to follow-up with urology and return to the ED for new or worsening symptoms, patient agrees with plan.    Blake Divine, MD 10/11/18 [phone]

## 2018-10-11 NOTE — ED Triage Notes (Signed)
Patient ambulatory to triage with steady gait, without difficulty or distress noted, mask in place; reports lithotripsy on Thursday for 55mm left kidney stone; c/o chills and pain and st has not passed any stones or debris

## 2018-10-11 NOTE — ED Notes (Signed)
Patient transported to CT 

## 2018-10-11 NOTE — ED Notes (Signed)
ED provider to speak with urology

## 2018-10-11 NOTE — Telephone Encounter (Signed)
PT.'S WIFE STATES ER GAVE HIM PERCOCET AND HE CAN NOT TAKE THEM BECAUSE THEY KNOCK HIM OUT AND HE SEES SPOTS IN FRONT OF HIS EYES.PLEASE CALL TO ADVISE

## 2018-10-11 NOTE — Telephone Encounter (Signed)
Spoke with patient and notified him that this can be a side effect of narcotic pain medications and he should not drive or operate heavy machinery while taking. If he can he should utilize OTC NSAIDs or tylenol for the pain and only take percocet when needed. Patient verbalized understanding. He states he wanted to make sure that this was a normal side effect, he had never taken Percocet before

## 2018-10-11 NOTE — ED Notes (Signed)
Dr Charna Archer at bedside to talk with pt.

## 2018-10-11 NOTE — H&P (View-Only) (Signed)
Urology Consult  I have been asked to see the patient by Dr. Scotty CourtStafford, for evaluation and management of left ureteral stone  Chief Complaint: left flank pain  History of Present Illness: Sean Sosa is a 58 y.o. year old with history of a 1 cm left UPJ stone status post ESWL last Thursday who returns to the ER with ongoing left flank pain.  He reports that following the procedure, he took it easy for 2 days with some discomfort but not severe.  He returned to work over the weekend and started having more more discomfort.  Yesterday evening, he was not able to get comfortable as the pain had migrated into his left mid abdomen, previously in his left flank.  He ultimately presented to the emergency room early this morning after he ran out of pain medication.  He denies any nausea, vomiting, fevers, or chills.  He did have one episode of sweating associated with the pain.  No dysuria or gross hematuria.  He does note today that he is not seen any fragments past.  The emergency room today, he appears comfortable and is otherwise normotensive and afebrile.  Imaging today in the form of CT abdomen pelvis, stone protocol, indicates that the stone infected fragment there is multiple stones in the left lower and midpole calyces, nonobstructing.  There is also approximately 8 mm left proximal ureteral stone with mild hydroureteronephrosis to this level.  Past Medical History:  Diagnosis Date  . Hypercholesteremia   . Nephrolithiasis   . Sleep apnea     Past Surgical History:  Procedure Laterality Date  . EXTRACORPOREAL SHOCK WAVE LITHOTRIPSY Left 10/06/2018   Procedure: EXTRACORPOREAL SHOCK WAVE LITHOTRIPSY (ESWL);  Surgeon: Riki AltesStoioff, Scott C, MD;  Location: [facility] ORS;  Service: Urology;  Laterality: Left;  . HAND SURGERY  1999  . HERNIA REPAIR  1970's    Home Medications:  No outpatient medications have been marked as taking for the 10/11/18 encounter [facility](Hospital Encounter).     Allergies: No Known Allergies  Family History  Problem Relation Age of Onset  . Prostate cancer Father   . Hematuria Father     Social History:  reports that he has been smoking cigarettes. He has been smoking about 1.50 packs per day. He has never used smokeless tobacco. He reports that he does not drink alcohol or use drugs.  ROS: A complete review of systems was performed.  All systems are negative except for pertinent findings as noted.  Physical Exam:  Vital signs in last 24 hours: Temp:  [98.3 F (36.8 C)] 98.3 F (36.8 C) (08/25 0431) Pulse Rate:  [75-100] 82 (08/25 0700) Resp:  [18-20] 18 (08/25 0700) BP: (141-158)/(88-97) 144/88 (08/25 0700) SpO2:  [92 %-95 %] 93 % (08/25 0700) Weight:  [122.5 kg] 122.5 kg (08/25 0431) Constitutional:  Alert and oriented, No acute distress HEENT: [place] AT, moist mucus membranes.  Trachea midline, no masses Cardiovascular: Regular rate and rhythm, no clubbing, cyanosis, or edema. Respiratory: Normal respiratory effort, lungs clear bilaterally GI: Abdomen is soft, nontender, nondistended, no abdominal masses GU: Minimal left CVA tenderness Neurologic: Grossly intact, no focal deficits, moving all 4 extremities Psychiatric: Normal mood and affect   Laboratory Data:  Recent Labs    10/11/18 0437  WBC 16.8*  HGB 14.9  HCT 43.9   Recent Labs    10/11/18 0437  NA 136  K 3.4*  CL 100  CO2 24  GLUCOSE 131*  BUN 16  CREATININE  1.27*  CALCIUM 8.8*   Component     Latest Ref Rng & Units 10/11/2018  Color, Urine     YELLOW AMBER (A)  Appearance     CLEAR CLEAR (A)  Specific Gravity, Urine     1.005 - 1.030 1.027  pH     5.0 - 8.0 5.0  Glucose, UA     NEGATIVE mg/dL NEGATIVE  Hgb urine dipstick     NEGATIVE MODERATE (A)  Bilirubin Urine     NEGATIVE NEGATIVE  Ketones, ur     NEGATIVE mg/dL 5 (A)  Protein     NEGATIVE mg/dL NEGATIVE  Nitrite     NEGATIVE NEGATIVE  Leukocytes,Ua     NEGATIVE NEGATIVE  RBC / HPF      0 - 5 RBC/hpf 21-50  WBC, UA     0 - 5 WBC/hpf 0-5  Bacteria, UA     NONE SEEN NONE SEEN  Squamous Epithelial / LPF     0 - 5 0-5  Mucus      PRESENT    Radiologic Imaging: Ct Renal Stone Study  Result Date: 10/11/2018 CLINICAL DATA:  Urinary tract stone EXAM: CT ABDOMEN AND PELVIS WITHOUT CONTRAST TECHNIQUE: Multidetector CT imaging of the abdomen and pelvis was performed following the standard protocol without IV contrast. COMPARISON:  Nine days ago FINDINGS: Lower chest:  Mild atelectasis in the lower lungs. Hepatobiliary: No focal liver abnormality.High-density layering in the gallbladder. No gallbladder inflammatory changes. Pancreas: Unremarkable. Spleen: Unremarkable. Adrenals/Urinary Tract: Negative adrenals. 8 x 6 mm stone in the upper left ureter with left hydronephrosis, perinephric stranding, and expansive renal low-density. At least 4 left renal calculi measuring up to 6 mm. Subcentimeter high-density nodule in the lower pole left kidney, apparently new, likely hemorrhagic cyst after interval lithotripsy. Negative bladder. Stomach/Bowel:  No obstruction. Mild sigmoid diverticulosis. Vascular/Lymphatic: No acute vascular abnormality. No mass or adenopathy. Reproductive:No pathologic findings. Other: No ascites or pneumoperitoneum. Musculoskeletal: No acute abnormalities. IMPRESSION: 1. Obstructing 8 x 6 mm stone in the upper left ureter. 2. Interval lithotripsy now with multiple left renal calculi. 3. Sludge or noncalcified calculi at the gallbladder. Electronically Signed   By: Monte Fantasia M.D.   On: 10/11/2018 05:40   CT scan was personally reviewed today.  This was compared to previous imaging studies prior to lithotripsy.  Agree with radiologic interpretation.  Impression/Assessment:  58 year old male with a large 1 cm UPJ stone status post ESWL with fragmentation of the stone but an obstructing 8 mm fragment with refractory pain.  He is otherwise nontoxic appearing and  normotensive, afebrile.  His urine is bland.  He does have a borderline leukocytosis but this is likely reactive.  In the emergency room, his pain was able to be controlled and he is appropriate for discharge.  In light of refractory pain and relatively large fragment, we discussed various options including continued medical expulsive therapy and trial of passage, add-on for ureteroscopy, laser lithotripsy to address both the obstructing ureteral calculus as well as nonobstructing stones versus returning to the lithotripsy truck on Thursday to treat the offending obstructing fragment as various options.  He is leaning towards ureteroscopy.  Risks and benefits of ureteroscopy were reviewed including but not limited to infection, bleeding, pain, ureteral injury which could require open surgery versus prolonged indwelling if ureteral perforation occurs, persistent stone disease, requirement for staged procedure, possible stent, and global anesthesia risks.   He like to discuss this with his wife he will call  our office later this morning to let us know I would like to proceed.  All questions were answered.   Plan:  -Appropriate for discharge from the emergency room with pain meds -Patient to call our office to let us know if he like to proceed with ureteroscopy or ESWL later this week -Warning symptoms and indication for return to the emergency room/ clinic were reviewed -All questions answered  10/11/2018, 8:26 AM  Vanna Scotland,  MD

## 2018-10-11 NOTE — Consult Note (Signed)
Urology Consult  I have been asked to see the patient by Dr. Scotty CourtStafford, for evaluation and management of left ureteral stone  Chief Complaint: left flank pain  History of Present Illness: Sean Sosa is a 58 y.o. year old with history of a 1 cm left UPJ stone status post ESWL last Thursday who returns to the ER with ongoing left flank pain.  He reports that following the procedure, he took it easy for 2 days with some discomfort but not severe.  He returned to work over the weekend and started having more more discomfort.  Yesterday evening, he was not able to get comfortable as the pain had migrated into his left mid abdomen, previously in his left flank.  He ultimately presented to the emergency room early this morning after he ran out of pain medication.  He denies any nausea, vomiting, fevers, or chills.  He did have one episode of sweating associated with the pain.  No dysuria or gross hematuria.  He does note today that he is not seen any fragments past.  The emergency room today, he appears comfortable and is otherwise normotensive and afebrile.  Imaging today in the form of CT abdomen pelvis, stone protocol, indicates that the stone infected fragment there is multiple stones in the left lower and midpole calyces, nonobstructing.  There is also approximately 8 mm left proximal ureteral stone with mild hydroureteronephrosis to this level.  Past Medical History:  Diagnosis Date  . Hypercholesteremia   . Nephrolithiasis   . Sleep apnea     Past Surgical History:  Procedure Laterality Date  . EXTRACORPOREAL SHOCK WAVE LITHOTRIPSY Left 10/06/2018   Procedure: EXTRACORPOREAL SHOCK WAVE LITHOTRIPSY (ESWL);  Surgeon: Riki AltesStoioff, Scott C, MD;  Location: [facility] ORS;  Service: Urology;  Laterality: Left;  . HAND SURGERY  1999  . HERNIA REPAIR  1970's    Home Medications:  No outpatient medications have been marked as taking for the 10/11/18 encounter [facility](Hospital Encounter).     Allergies: No Known Allergies  Family History  Problem Relation Age of Onset  . Prostate cancer Father   . Hematuria Father     Social History:  reports that he has been smoking cigarettes. He has been smoking about 1.50 packs per day. He has never used smokeless tobacco. He reports that he does not drink alcohol or use drugs.  ROS: A complete review of systems was performed.  All systems are negative except for pertinent findings as noted.  Physical Exam:  Vital signs in last 24 hours: Temp:  [98.3 F (36.8 C)] 98.3 F (36.8 C) (08/25 0431) Pulse Rate:  [75-100] 82 (08/25 0700) Resp:  [18-20] 18 (08/25 0700) BP: (141-158)/(88-97) 144/88 (08/25 0700) SpO2:  [92 %-95 %] 93 % (08/25 0700) Weight:  [122.5 kg] 122.5 kg (08/25 0431) Constitutional:  Alert and oriented, No acute distress HEENT: [place] AT, moist mucus membranes.  Trachea midline, no masses Cardiovascular: Regular rate and rhythm, no clubbing, cyanosis, or edema. Respiratory: Normal respiratory effort, lungs clear bilaterally GI: Abdomen is soft, nontender, nondistended, no abdominal masses GU: Minimal left CVA tenderness Neurologic: Grossly intact, no focal deficits, moving all 4 extremities Psychiatric: Normal mood and affect   Laboratory Data:  Recent Labs    10/11/18 0437  WBC 16.8*  HGB 14.9  HCT 43.9   Recent Labs    10/11/18 0437  NA 136  K 3.4*  CL 100  CO2 24  GLUCOSE 131*  BUN 16  CREATININE  1.27*  CALCIUM 8.8*   Component     Latest Ref Rng & Units 10/11/2018  Color, Urine     YELLOW AMBER (A)  Appearance     CLEAR CLEAR (A)  Specific Gravity, Urine     1.005 - 1.030 1.027  pH     5.0 - 8.0 5.0  Glucose, UA     NEGATIVE mg/dL NEGATIVE  Hgb urine dipstick     NEGATIVE MODERATE (A)  Bilirubin Urine     NEGATIVE NEGATIVE  Ketones, ur     NEGATIVE mg/dL 5 (A)  Protein     NEGATIVE mg/dL NEGATIVE  Nitrite     NEGATIVE NEGATIVE  Leukocytes,Ua     NEGATIVE NEGATIVE  RBC / HPF      0 - 5 RBC/hpf 21-50  WBC, UA     0 - 5 WBC/hpf 0-5  Bacteria, UA     NONE SEEN NONE SEEN  Squamous Epithelial / LPF     0 - 5 0-5  Mucus      PRESENT    Radiologic Imaging: Ct Renal Stone Study  Result Date: 10/11/2018 CLINICAL DATA:  Urinary tract stone EXAM: CT ABDOMEN AND PELVIS WITHOUT CONTRAST TECHNIQUE: Multidetector CT imaging of the abdomen and pelvis was performed following the standard protocol without IV contrast. COMPARISON:  Nine days ago FINDINGS: Lower chest:  Mild atelectasis in the lower lungs. Hepatobiliary: No focal liver abnormality.High-density layering in the gallbladder. No gallbladder inflammatory changes. Pancreas: Unremarkable. Spleen: Unremarkable. Adrenals/Urinary Tract: Negative adrenals. 8 x 6 mm stone in the upper left ureter with left hydronephrosis, perinephric stranding, and expansive renal low-density. At least 4 left renal calculi measuring up to 6 mm. Subcentimeter high-density nodule in the lower pole left kidney, apparently new, likely hemorrhagic cyst after interval lithotripsy. Negative bladder. Stomach/Bowel:  No obstruction. Mild sigmoid diverticulosis. Vascular/Lymphatic: No acute vascular abnormality. No mass or adenopathy. Reproductive:No pathologic findings. Other: No ascites or pneumoperitoneum. Musculoskeletal: No acute abnormalities. IMPRESSION: 1. Obstructing 8 x 6 mm stone in the upper left ureter. 2. Interval lithotripsy now with multiple left renal calculi. 3. Sludge or noncalcified calculi at the gallbladder. Electronically Signed   By: Monte Fantasia M.D.   On: 10/11/2018 05:40   CT scan was personally reviewed today.  This was compared to previous imaging studies prior to lithotripsy.  Agree with radiologic interpretation.  Impression/Assessment:  58 year old male with a large 1 cm UPJ stone status post ESWL with fragmentation of the stone but an obstructing 8 mm fragment with refractory pain.  He is otherwise nontoxic appearing and  normotensive, afebrile.  His urine is bland.  He does have a borderline leukocytosis but this is likely reactive.  In the emergency room, his pain was able to be controlled and he is appropriate for discharge.  In light of refractory pain and relatively large fragment, we discussed various options including continued medical expulsive therapy and trial of passage, add-on for ureteroscopy, laser lithotripsy to address both the obstructing ureteral calculus as well as nonobstructing stones versus returning to the lithotripsy truck on Thursday to treat the offending obstructing fragment as various options.  He is leaning towards ureteroscopy.  Risks and benefits of ureteroscopy were reviewed including but not limited to infection, bleeding, pain, ureteral injury which could require open surgery versus prolonged indwelling if ureteral perforation occurs, persistent stone disease, requirement for staged procedure, possible stent, and global anesthesia risks.   He like to discuss this with his wife he will call  our office later this morning to let us know I would like to proceed.  All questions were answered.   Plan:  -Appropriate for discharge from the emergency room with pain meds -Patient to call our office to let us know if he like to proceed with ureteroscopy or ESWL later this week -Warning symptoms and indication for return to the emergency room/ clinic were reviewed -All questions answered  10/11/2018, 8:26 AM  Vanna ScotlandAshley Nathan Stallworth,  MD

## 2018-10-11 NOTE — ED Provider Notes (Signed)
[facility] Emergency Department Provider Note  ____________________________________________  Time seen: Approximately 5:20 AM  I have reviewed the triage vital signs and the nursing notes.   HISTORY  Chief Complaint Flank Pain    HPI Sean Sosa is a 58 y.o. male with a history of kidney stones and hyperlipidemia who comes the ED complaining of left flank pain, radiating to left lower quadrant, severe 9/10, associated with chills.  Reports he had a lithotripsy of a 10 mm left kidney stone 5 days ago with no relief of his symptoms.  He has been using a urine strainer but has not noticed any stones.      Past Medical History:  Diagnosis Date  . Hypercholesteremia   . Nephrolithiasis   . Sleep apnea      Patient Active Problem List   Diagnosis Date Noted  . OSA on CPAP 09/11/2015  . Essential (primary) hypertension 02/02/2014  . Current tobacco use 02/02/2014     Past Surgical History:  Procedure Laterality Date  . EXTRACORPOREAL SHOCK WAVE LITHOTRIPSY Left 10/06/2018   Procedure: EXTRACORPOREAL SHOCK WAVE LITHOTRIPSY (ESWL);  Surgeon: Riki Altes, MD;  Location: [facility] ORS;  Service: Urology;  Laterality: Left;  . HAND SURGERY  1999  . HERNIA REPAIR  1970's     Prior to Admission medications   Medication Sig Start Date End Date Taking? Authorizing Provider  aspirin EC 81 MG tablet Take 81 mg by mouth every evening.  11/24/13   [provider]  ondansetron (ZOFRAN ODT) 4 MG disintegrating tablet Take 1 tablet (4 mg total) by mouth every 8 (eight) hours as needed for nausea or vomiting. 10/07/18   Stoioff, Verna Czech, MD  oxyCODONE-acetaminophen (PERCOCET) 5-325 MG tablet Take 1 tablet by mouth every 4 (four) hours as needed for severe pain. 10/07/18 10/07/19  Stoioff, Verna Czech, MD  tamsulosin (FLOMAX) 0.4 MG CAPS capsule Take 1 capsule (0.4 mg total) by mouth daily. 08/08/18   Michiel Cowboy A, PA-C     Allergies Patient has no  known allergies.   Family History  Problem Relation Age of Onset  . Prostate cancer Father   . Hematuria Father     Social History Social History   Tobacco Use  . Smoking status: Current Every Day Smoker    Packs/day: 1.50    Types: Cigarettes  . Smokeless tobacco: Never Used  Substance Use Topics  . Alcohol use: No  . Drug use: Never    Review of Systems  Constitutional:   No fever or chills.  ENT:   No sore throat. No rhinorrhea. Cardiovascular:   No chest pain or syncope. Respiratory:   No dyspnea or cough. Gastrointestinal:   Left flank pain as above.  No vomiting or diarrhea.  Constipated x1 week Musculoskeletal:   Negative for focal pain or swelling All other systems reviewed and are negative except as documented above in ROS and HPI.  ____________________________________________   PHYSICAL EXAM:  VITAL SIGNS: ED Triage Vitals [10/11/18 0431]  Enc Vitals Group     BP (!) 157/90     Pulse Rate 100     Resp 20     Temp 98.3 F (36.8 C)     Temp Source Oral     SpO2 94 %     Weight 270 lb (122.5 kg)     Height 6\' 1"  (1.854 m)     Head Circumference      Peak Flow  Pain Score 8     Pain Loc      Pain Edu?      Excl. in GC?     Vital signs reviewed, nursing assessments reviewed.   Constitutional:   Alert and oriented. Non-toxic appearance. Eyes:   Conjunctivae are normal. EOMI. PERRL. ENT      Head:   Normocephalic and atraumatic.      Nose:   No congestion/rhinnorhea.       Mouth/Throat:   MMM, no pharyngeal erythema. No peritonsillar mass.       Neck:   No meningismus. Full ROM. Hematological/Lymphatic/Immunilogical:   No cervical lymphadenopathy. Cardiovascular:   RRR. Symmetric bilateral radial and DP pulses.  No murmurs. Cap refill less than 2 seconds. Respiratory:   Normal respiratory effort without tachypnea/retractions. Breath sounds are clear and equal bilaterally. No wheezes/rales/rhonchi. Gastrointestinal:   Soft and nontender. Non  distended. There is no CVA tenderness.  No rebound, rigidity, or guarding.  Musculoskeletal:   Normal range of motion in all extremities. No joint effusions.  No lower extremity tenderness.  No edema. Neurologic:   Normal speech and language.  Motor grossly intact. No acute focal neurologic deficits are appreciated.  Skin:    Skin is warm, dry and intact. No rash noted.  No petechiae, purpura, or bullae.  ____________________________________________    LABS (pertinent positives/negatives) (all labs ordered are listed, but only abnormal results are displayed) Labs Reviewed  BASIC METABOLIC PANEL - Abnormal; Notable for the following components:      Result Value   Potassium 3.4 (*)    Glucose, Bld 131 (*)    Creatinine, Ser 1.27 (*)    Calcium 8.8 (*)    All other components within normal limits  CBC WITH DIFFERENTIAL/PLATELET - Abnormal; Notable for the following components:   WBC 16.8 (*)    Neutro Abs 11.1 (*)    Lymphs Abs 4.2 (*)    Monocytes Absolute 1.3 (*)    Abs Immature Granulocytes 0.10 (*)    All other components within normal limits  URINALYSIS, COMPLETE (UACMP) WITH MICROSCOPIC - Abnormal; Notable for the following components:   Color, Urine AMBER (*)    APPearance CLEAR (*)    Hgb urine dipstick MODERATE (*)    Ketones, ur 5 (*)    All other components within normal limits  URINE CULTURE   ____________________________________________   EKG    ____________________________________________    RADIOLOGY  Ct Renal Stone Study  Result Date: 10/11/2018 CLINICAL DATA:  Urinary tract stone EXAM: CT ABDOMEN AND PELVIS WITHOUT CONTRAST TECHNIQUE: Multidetector CT imaging of the abdomen and pelvis was performed following the standard protocol without IV contrast. COMPARISON:  Nine days ago FINDINGS: Lower chest:  Mild atelectasis in the lower lungs. Hepatobiliary: No focal liver abnormality.High-density layering in the gallbladder. No gallbladder inflammatory  changes. Pancreas: Unremarkable. Spleen: Unremarkable. Adrenals/Urinary Tract: Negative adrenals. 8 x 6 mm stone in the upper left ureter with left hydronephrosis, perinephric stranding, and expansive renal low-density. At least 4 left renal calculi measuring up to 6 mm. Subcentimeter high-density nodule in the lower pole left kidney, apparently new, likely hemorrhagic cyst after interval lithotripsy. Negative bladder. Stomach/Bowel:  No obstruction. Mild sigmoid diverticulosis. Vascular/Lymphatic: No acute vascular abnormality. No mass or adenopathy. Reproductive:No pathologic findings. Other: No ascites or pneumoperitoneum. Musculoskeletal: No acute abnormalities. IMPRESSION: 1. Obstructing 8 x 6 mm stone in the upper left ureter. 2. Interval lithotripsy now with multiple left renal calculi. 3. Sludge or noncalcified calculi at  the gallbladder. Electronically Signed   By: Monte Fantasia M.D.   On: 10/11/2018 05:40    ____________________________________________   PROCEDURES Procedures  ____________________________________________  DIFFERENTIAL DIAGNOSIS   Ureterolithiasis, renal hematoma, constipation high-grade ureteral obstruction, urinary tract infection  CLINICAL IMPRESSION / ASSESSMENT AND PLAN / ED COURSE  Medications ordered in the ED: Medications  HYDROmorphone (DILAUDID) injection 1 mg (has no administration in time range)  sodium chloride 0.9 % bolus 1,000 mL (1,000 mLs Intravenous New Bag/Given 10/11/18 0453)  fentaNYL (SUBLIMAZE) injection 50 mcg (50 mcg Intravenous Given 10/11/18 0516)    Pertinent labs & imaging results that were available during my care of the patient were reviewed by me and considered in my medical decision making (see chart for details).  Sean Sosa was evaluated in Emergency Department on 10/11/2018 for the symptoms described in the history of present illness. He was evaluated in the context of the global COVID-19 pandemic, which necessitated  consideration that the patient might be at risk for infection with the SARS-CoV-2 virus that causes COVID-19. Institutional protocols and algorithms that pertain to the evaluation of patients at risk for COVID-19 are in a state of rapid change based on information released by regulatory bodies including the CDC and federal and state organizations. These policies and algorithms were followed during the patient's care in the ED.   Patient presents with persistent flank pain after lithotripsy 4 days ago.  Vital signs unremarkable.  Patient given 50 mcg of fentanyl IV for initial pain control.  Labs reveal hematuria, slight increase of creatinine to 1.2 compared to baseline of 0.7, leukocytosis.  All attributable to recent lithotripsy.  Urinalysis not consistent with infection, no evidence of abscess or other complication.   ----------------------------------------- 6:23 AM on 10/11/2018 ----------------------------------------- Case discussed with urology Dr. Erlene Quan who will come evaluate the patient in the ED.  We will follow-up recommendations  Patient still having 8/10 pain.  Dilaudid 1 mg IV ordered.  Care of the patient will be signed out at 7:00 AM to Dr. Charna Archer.      ____________________________________________   FINAL CLINICAL IMPRESSION(S) / ED DIAGNOSES    Final diagnoses:  Other microscopic hematuria  Ureterolithiasis     ED Discharge Orders    None      Portions of this note were generated with dragon dictation software. Dictation errors may occur despite best attempts at proofreading.   Carrie Mew, MD 10/11/18 [phone]

## 2018-10-11 NOTE — ED Notes (Signed)
Pt reports urology has been in. Asking about disposition. Dr Charna Archer notified of what patient reported.

## 2018-10-12 ENCOUNTER — Ambulatory Visit: Payer: PRIVATE HEALTH INSURANCE | Admitting: Anesthesiology

## 2018-10-12 ENCOUNTER — Ambulatory Visit
Admission: RE | Admit: 2018-10-12 | Discharge: 2018-10-12 | Disposition: A | Discharge status: Home / Self Care | Payer: PRIVATE HEALTH INSURANCE | Attending: Urology | Admitting: Urology

## 2018-10-12 ENCOUNTER — Encounter: Payer: Self-pay | Admitting: *Deleted

## 2018-10-12 ENCOUNTER — Other Ambulatory Visit: Payer: Self-pay

## 2018-10-12 ENCOUNTER — Encounter
Admission: RE | Disposition: A | Discharge status: Home / Self Care | Payer: Self-pay | Source: Home / Self Care | Attending: Urology

## 2018-10-12 DIAGNOSIS — G4730 Sleep apnea, unspecified: Secondary | ICD-10-CM | POA: Diagnosis not present

## 2018-10-12 DIAGNOSIS — F17210 Nicotine dependence, cigarettes, uncomplicated: Secondary | ICD-10-CM | POA: Diagnosis not present

## 2018-10-12 DIAGNOSIS — N400 Enlarged prostate without lower urinary tract symptoms: Secondary | ICD-10-CM | POA: Diagnosis not present

## 2018-10-12 DIAGNOSIS — N201 Calculus of ureter: Secondary | ICD-10-CM | POA: Diagnosis present

## 2018-10-12 DIAGNOSIS — N132 Hydronephrosis with renal and ureteral calculous obstruction: Secondary | ICD-10-CM | POA: Insufficient documentation

## 2018-10-12 DIAGNOSIS — I10 Essential (primary) hypertension: Secondary | ICD-10-CM | POA: Diagnosis not present

## 2018-10-12 DIAGNOSIS — N202 Calculus of kidney with calculus of ureter: Secondary | ICD-10-CM | POA: Diagnosis not present

## 2018-10-12 DIAGNOSIS — N200 Calculus of kidney: Secondary | ICD-10-CM

## 2018-10-12 HISTORY — PX: CYSTOSCOPY/URETEROSCOPY/HOLMIUM LASER/STENT PLACEMENT: SHX6546

## 2018-10-12 HISTORY — PX: CYSTOSCOPY W/ RETROGRADES: SHX1426

## 2018-10-12 LAB — URINE CULTURE: Culture: NO GROWTH

## 2018-10-12 SURGERY — CYSTOSCOPY/URETEROSCOPY/HOLMIUM LASER/STENT PLACEMENT
Anesthesia: General | Laterality: Left

## 2018-10-12 MED ORDER — PROPOFOL 10 MG/ML IV BOLUS
INTRAVENOUS | Status: DC | PRN
  Administered 2018-10-12: 200 mg via INTRAVENOUS

## 2018-10-12 MED ORDER — ROCURONIUM BROMIDE 50 MG/5ML IV SOLN
INTRAVENOUS | Status: AC
  Filled 2018-10-12: qty 1

## 2018-10-12 MED ORDER — IOPAMIDOL (ISOVUE-200) INJECTION 41%
INTRAVENOUS | Status: DC | PRN
  Administered 2018-10-12: 15:00:00 60 mL

## 2018-10-12 MED ORDER — FAMOTIDINE 20 MG PO TABS
20.0000 mg | ORAL_TABLET | Freq: Once | ORAL | Status: AC
  Administered 2018-10-12: 13:00:00 20 mg via ORAL

## 2018-10-12 MED ORDER — FAMOTIDINE 20 MG PO TABS
ORAL_TABLET | ORAL | Status: AC
  Administered 2018-10-12: 20 mg via ORAL
  Filled 2018-10-12: qty 1

## 2018-10-12 MED ORDER — ONDANSETRON HCL 4 MG/2ML IJ SOLN
INTRAMUSCULAR | Status: AC
  Filled 2018-10-12: qty 2

## 2018-10-12 MED ORDER — OXYCODONE HCL 5 MG/5ML PO SOLN: 5.0000 mg | Freq: Once | ORAL | Status: AC | PRN

## 2018-10-12 MED ORDER — MEPERIDINE HCL 50 MG/ML IJ SOLN: 6.2500 mg | INTRAMUSCULAR | Status: DC | PRN

## 2018-10-12 MED ORDER — LIDOCAINE HCL (PF) 2 % IJ SOLN
INTRAMUSCULAR | Status: AC
  Filled 2018-10-12: qty 10

## 2018-10-12 MED ORDER — OXYCODONE HCL 5 MG PO TABS
ORAL_TABLET | ORAL | Status: AC
  Administered 2018-10-12: 5 mg via ORAL
  Filled 2018-10-12: qty 1

## 2018-10-12 MED ORDER — SUGAMMADEX SODIUM 200 MG/2ML IV SOLN
INTRAVENOUS | Status: AC
  Filled 2018-10-12: qty 2

## 2018-10-12 MED ORDER — FENTANYL CITRATE (PF) 100 MCG/2ML IJ SOLN
INTRAMUSCULAR | Status: DC | PRN
  Administered 2018-10-12 (×2): 50 ug via INTRAVENOUS

## 2018-10-12 MED ORDER — MIDAZOLAM HCL 2 MG/2ML IJ SOLN
INTRAMUSCULAR | Status: DC | PRN
  Administered 2018-10-12: 2 mg via INTRAVENOUS

## 2018-10-12 MED ORDER — ONDANSETRON HCL 4 MG/2ML IJ SOLN
INTRAMUSCULAR | Status: DC | PRN
  Administered 2018-10-12: 4 mg via INTRAVENOUS

## 2018-10-12 MED ORDER — LIDOCAINE HCL (CARDIAC) PF 100 MG/5ML IV SOSY
PREFILLED_SYRINGE | INTRAVENOUS | Status: DC | PRN
  Administered 2018-10-12: 80 mg via INTRAVENOUS

## 2018-10-12 MED ORDER — LACTATED RINGERS IV SOLN
INTRAVENOUS | Status: DC
  Administered 2018-10-12: 13:00:00 via INTRAVENOUS

## 2018-10-12 MED ORDER — ROCURONIUM BROMIDE 100 MG/10ML IV SOLN
INTRAVENOUS | Status: DC | PRN
  Administered 2018-10-12: 40 mg via INTRAVENOUS
  Administered 2018-10-12: 10 mg via INTRAVENOUS

## 2018-10-12 MED ORDER — MIDAZOLAM HCL 2 MG/2ML IJ SOLN
INTRAMUSCULAR | Status: AC
  Filled 2018-10-12: qty 2

## 2018-10-12 MED ORDER — OXYCODONE HCL 5 MG PO TABS
5.0000 mg | ORAL_TABLET | Freq: Once | ORAL | Status: AC | PRN
  Administered 2018-10-12: 16:00:00 5 mg via ORAL

## 2018-10-12 MED ORDER — OXYBUTYNIN CHLORIDE 5 MG PO TABS: ORAL_TABLET | ORAL | 0 refills | Status: DC

## 2018-10-12 MED ORDER — PROMETHAZINE HCL 25 MG/ML IJ SOLN: 6.2500 mg | INTRAMUSCULAR | Status: DC | PRN

## 2018-10-12 MED ORDER — SEVOFLURANE IN SOLN
RESPIRATORY_TRACT | Status: AC
  Filled 2018-10-12: qty 250

## 2018-10-12 MED ORDER — PROPOFOL 10 MG/ML IV BOLUS
INTRAVENOUS | Status: AC
  Filled 2018-10-12: qty 20

## 2018-10-12 MED ORDER — SUGAMMADEX SODIUM 200 MG/2ML IV SOLN
INTRAVENOUS | Status: DC | PRN
  Administered 2018-10-12: 244 mg via INTRAVENOUS

## 2018-10-12 MED ORDER — FENTANYL CITRATE (PF) 100 MCG/2ML IJ SOLN
INTRAMUSCULAR | Status: AC
  Filled 2018-10-12: qty 2

## 2018-10-12 MED ORDER — CEFAZOLIN SODIUM-DEXTROSE 2-4 GM/100ML-% IV SOLN
INTRAVENOUS | Status: AC
  Filled 2018-10-12: qty 100

## 2018-10-12 MED ORDER — FENTANYL CITRATE (PF) 100 MCG/2ML IJ SOLN: 25.0000 ug | INTRAMUSCULAR | Status: DC | PRN

## 2018-10-12 SURGICAL SUPPLY — 34 items
BAG DRAIN CYSTO-URO LG1000N (MISCELLANEOUS) ×3 IMPLANT
BALLN URETL DIL 7X10 (BALLOONS) ×3
BALLOON URETL DIL 7X10 (BALLOONS) IMPLANT
BASKET ZERO TIP 1.9FR (BASKET) IMPLANT
BRUSH SCRUB EZ 1% IODOPHOR (MISCELLANEOUS) ×3 IMPLANT
BSKT STON RTRVL ZERO TP 1.9FR (BASKET)
CATH URETL 5X70 OPEN END (CATHETERS) IMPLANT
CNTNR SPEC 2.5X3XGRAD LEK (MISCELLANEOUS)
CONT SPEC 4OZ STER OR WHT (MISCELLANEOUS)
CONT SPEC 4OZ STRL OR WHT (MISCELLANEOUS)
CONTAINER SPEC 2.5X3XGRAD LEK (MISCELLANEOUS) IMPLANT
DRAPE UTILITY 15X26 TOWEL STRL (DRAPES) ×3 IMPLANT
FIBER LASER TRAC TIP (UROLOGICAL SUPPLIES) IMPLANT
GLOVE BIO SURGEON STRL SZ8 (GLOVE) ×3 IMPLANT
GOWN STRL REUS W/ TWL LRG LVL3 (GOWN DISPOSABLE) ×1 IMPLANT
GOWN STRL REUS W/ TWL XL LVL3 (GOWN DISPOSABLE) ×1 IMPLANT
GOWN STRL REUS W/TWL LRG LVL3 (GOWN DISPOSABLE) ×3
GOWN STRL REUS W/TWL XL LVL3 (GOWN DISPOSABLE) ×3
GUIDEWIRE STR DUAL SENSOR (WIRE) ×3 IMPLANT
INFUSOR MANOMETER BAG 3000ML (MISCELLANEOUS) ×3 IMPLANT
INTRODUCER DILATOR DOUBLE (INTRODUCER) ×2 IMPLANT
KIT TURNOVER CYSTO (KITS) ×3 IMPLANT
PACK CYSTO AR (MISCELLANEOUS) ×3 IMPLANT
SET CYSTO W/LG BORE CLAMP LF (SET/KITS/TRAYS/PACK) ×3 IMPLANT
SHEATH URETERAL 12FR 45CM (SHEATH) ×2 IMPLANT
SHEATH URETERAL 12FRX35CM (MISCELLANEOUS) IMPLANT
SOL .9 NS 3000ML IRR  AL (IV SOLUTION) ×2
SOL .9 NS 3000ML IRR AL (IV SOLUTION) ×1
SOL .9 NS 3000ML IRR UROMATIC (IV SOLUTION) ×1 IMPLANT
STENT URET 6FRX24 CONTOUR (STENTS) IMPLANT
STENT URET 6FRX26 CONTOUR (STENTS) IMPLANT
SURGILUBE 2OZ TUBE FLIPTOP (MISCELLANEOUS) ×3 IMPLANT
VALVE UROSEAL ADJ ENDO (VALVE) ×2 IMPLANT
WATER STERILE IRR 1000ML POUR (IV SOLUTION) ×3 IMPLANT

## 2018-10-12 NOTE — Anesthesia Procedure Notes (Signed)
Procedure Name: Intubation Date/Time: 10/12/2018 1:52 PM Performed by: Jerrye Noble, CRNA Pre-anesthesia Checklist: Patient identified, Emergency Drugs available, Suction available, Patient being monitored and Timeout performed Patient Re-evaluated:Patient Re-evaluated prior to induction Oxygen Delivery Method: Circle system utilized Preoxygenation: Pre-oxygenation with 100% oxygen Induction Type: IV induction Ventilation: Mask ventilation with difficulty and Two handed mask ventilation required Laryngoscope Size: Mac and 3 Grade View: Grade I Tube type: Oral Tube size: 7.5 mm Number of attempts: 1 Airway Equipment and Method: Stylet Placement Confirmation: ETT inserted through vocal cords under direct vision,  positive ETCO2 and breath sounds checked- equal and bilateral Tube secured with: Tape Dental Injury: Teeth and Oropharynx as per pre-operative assessment

## 2018-10-12 NOTE — Anesthesia Postprocedure Evaluation (Signed)
Anesthesia Post Note  Patient: AMIRE LEAZER  Procedure(s) Performed: CYSTOSCOPY/URETEROSCOPY/STENT PLACEMENT (Left ) URETERAL DILITATION (Left ) CYSTOSCOPY WITH RETROGRADE PYELOGRAM (Left )  Patient location during evaluation: PACU Anesthesia Type: General Level of consciousness: awake and alert and oriented Pain management: pain level controlled Vital Signs Assessment: post-procedure vital signs reviewed and stable Respiratory status: spontaneous breathing, nonlabored ventilation and respiratory function stable Cardiovascular status: blood pressure returned to baseline and stable Postop Assessment: no signs of nausea or vomiting Anesthetic complications: no     Last Vitals:  Vitals:   10/12/18 1212 10/12/18 1500  BP: (!) 143/97 (!) 154/90  Pulse: 88 86  Resp: 16 (!) 24  Temp: 37.3 C (!) 36.2 C  SpO2:  100%    Last Pain:  Vitals:   10/12/18 1500  TempSrc:   PainSc: 0-No pain                 Sheela Mcculley

## 2018-10-12 NOTE — Discharge Instructions (Signed)
DISCHARGE INSTRUCTIONS FOR KIDNEY STONE/URETERAL STENT   MEDICATIONS:  1. Resume all your other meds from home.  2.  AZO (over-the-counter) can help with the burning/stinging when you urinate. 3.  Oxybutynin is for frequency, urgency and bladder spasms., Rx was sent to your pharmacy.  ACTIVITY:  1. May resume regular activities in 24 hours. 2. No driving while on narcotic pain medications  3. Drink plenty of water  4. Continue to walk at home - you can still get blood clots when you are at home, so keep active, but don't over do it.  5. May return to work/school tomorrow or when you feel ready   BATHING:  1. You can shower.   SIGNS/SYMPTOMS TO CALL:  Please call us if you have a fever greater than 101.5, uncontrolled nausea/vomiting, uncontrolled pain, dizziness, unable to urinate, excessively bloody urine, chest pain, shortness of breath, leg swelling, leg pain, or any other concerns or questions.   Urinary frequency, urgency and bladder spasms are common postoperative symptoms.  You can reach [place] at [phone].   FOLLOW-UP:  1. You we will be contacted for a follow-up appointment.   AMBULATORY SURGERY  DISCHARGE INSTRUCTIONS   1) The drugs that you were given will stay in your system until tomorrow so for the next 24 hours you should not:  A) Drive an automobile B) Make any legal decisions C) Drink any alcoholic beverage   2) You may resume regular meals tomorrow.  Today it is better to start with liquids and gradually work up to solid foods.  You may eat anything you prefer, but it is better to start with liquids, then soup and crackers, and gradually work up to solid foods.   3) Please notify your doctor immediately if you have any unusual bleeding, trouble breathing, redness and pain at the surgery site, drainage, fever, or pain not relieved by medication.    4) Additional Instructions:        Please contact your physician with any problems or Same Day  Surgery at [phone], Monday through Friday 6 am to 4 pm, or [place] [facility] number at [phone].

## 2018-10-12 NOTE — Anesthesia Post-op Follow-up Note (Signed)
Anesthesia QCDR form completed.        

## 2018-10-12 NOTE — Transfer of Care (Signed)
Immediate Anesthesia Transfer of Care Note  Patient: Sean Sosa  Procedure(s) Performed: CYSTOSCOPY/URETEROSCOPY/STENT PLACEMENT (Left ) URETERAL DILITATION (Left ) CYSTOSCOPY WITH RETROGRADE PYELOGRAM (Left )  Patient Location: PACU  Anesthesia Type:General  Level of Consciousness: awake, alert  and oriented  Airway & Oxygen Therapy: Patient Spontanous Breathing and Patient connected to face mask oxygen  Post-op Assessment: Report given to RN and Post -op Vital signs reviewed and stable  Post vital signs: Reviewed and stable  Last Vitals:  Vitals Value Taken Time  BP 154/90 10/12/18 1501  Temp    Pulse 81 10/12/18 1502  Resp 24 10/12/18 1502  SpO2 99 % 10/12/18 1502  Vitals shown include unvalidated device data.  Last Pain:  Vitals:   10/12/18 1212  TempSrc: Oral  PainSc: 0-No pain         Complications: No apparent anesthesia complications

## 2018-10-12 NOTE — Anesthesia Preprocedure Evaluation (Signed)
Anesthesia Evaluation  Patient identified by MRN, date of birth, ID band Patient awake    Reviewed: Allergy & Precautions, NPO status , Patient's Chart, lab work & pertinent test results  History of Anesthesia Complications Negative for: history of anesthetic complications  Airway Mallampati: II  TM Distance: >3 FB Neck ROM: Full    Dental  (+) Poor Dentition, Missing   Pulmonary sleep apnea , Current SmokerPatient did not abstain from smoking.,    breath sounds clear to auscultation- rhonchi (-) wheezing      Cardiovascular hypertension, (-) CAD, (-) Past MI, (-) Cardiac Stents and (-) CABG  Rhythm:Regular Rate:Normal - Systolic murmurs and - Diastolic murmurs    Neuro/Psych neg Seizures negative neurological ROS  negative psych ROS   GI/Hepatic negative GI ROS, Neg liver ROS,   Endo/Other  negative endocrine ROSneg diabetes  Renal/GU Renal disease: nephrolithiasis.     Musculoskeletal negative musculoskeletal ROS (+)   Abdominal   Peds  Hematology negative hematology ROS (+)   Anesthesia Other Findings Past Medical History: No date: Hypercholesteremia No date: Nephrolithiasis No date: Sleep apnea   Reproductive/Obstetrics                             Anesthesia Physical Anesthesia Plan  ASA: II  Anesthesia Plan: General   Post-op Pain Management:    Induction: Intravenous  PONV Risk Score and Plan: 0 and Ondansetron  Airway Management Planned: Oral ETT  Additional Equipment:   Intra-op Plan:   Post-operative Plan: Extubation in OR  Informed Consent: I have reviewed the patients History and Physical, chart, labs and discussed the procedure including the risks, benefits and alternatives for the proposed anesthesia with the patient or authorized representative who has indicated his/her understanding and acceptance.     Dental advisory given  Plan Discussed with: CRNA  and Anesthesiologist  Anesthesia Plan Comments:         Anesthesia Quick Evaluation

## 2018-10-13 ENCOUNTER — Encounter: Payer: Self-pay | Admitting: Urology

## 2018-10-13 ENCOUNTER — Other Ambulatory Visit: Payer: No Typology Code available for payment source | Admitting: Urology

## 2018-10-14 ENCOUNTER — Other Ambulatory Visit: Payer: Self-pay | Admitting: Urology

## 2018-10-14 DIAGNOSIS — N200 Calculus of kidney: Secondary | ICD-10-CM

## 2018-10-14 NOTE — Op Note (Signed)
Preoperative diagnosis:  1.  Left proximal ureteral calculus 2.  Left nephrolithiasis   Postoperative diagnosis:  1.  Left proximal ureteral calculus 2.  Left nephrolithiasis   Procedure:  1. Cystoscopy 2. Left ureteroscopy  3. Left ureteral stent placement (6FR) 26 cm 4. Left retrograde pyelography with interpretation  Surgeon: Nicki Reaper C. Stoioff, M.D.  Anesthesia: General  Complications: None  Intraoperative findings:  1.  Left retrograde pyelography demonstrated a filling defect within the left proximal ureter consistent with the patient's known calculus with marked narrowing of the ureter distal to the stone   EBL: Minimal  Specimens: 1. None   Indication: Sean Sosa is a 58 y.o. year old patient with status post shockwave lithotripsy of a 10 mm left UPJ calculus on 10/06/2018.  He has had intermittent renal colic since the procedure and presented to the ED on 10/11/2018 with persistent left flank pain.  Repeat CT showed an 8 mm left proximal ureteral fragment with mild hydronephrosis.  There also nonobstructing lower pole renal calculi.  After reviewing the management options for treatment, the patient elected to proceed with the above surgical procedure(s). We have discussed the potential benefits and risks of the procedure, side effects of the proposed treatment, the likelihood of the patient achieving the goals of the procedure, and any potential problems that might occur during the procedure or recuperation. Informed consent has been obtained.  Description of procedure:  The patient was taken to the operating room and general anesthesia was induced.  The patient was placed in the dorsal lithotomy position, prepped and draped in the usual sterile fashion, and preoperative antibiotics were administered. A preoperative time-out was performed.   A 21 French cystoscope was lubricated and passed under direct vision.  The urethra was normal in caliber without stricture.   The prostate demonstrated mild lateral lobe enlargement.  Panendoscopy was performed and the bladder mucosa showed no erythema, solid or papillary lesions.  Attention was directed to the left ureteral orifice and a 0.038 Sensor wire was then advanced up the ureter into the renal pelvis under fluoroscopic guidance.  The cystoscope was removed and a dual-lumen catheter was placed over the sensor wire however the catheter would not advance into the distal ureter.  The inner stylette of a 12/14 [place] access sheath would also not advance.  A 21 French/10 cm UroMax dilating balloon was placed over the guidewire and the ureter was dilated at a pressure of 15 atm.  There was initial narrowing in the distal ureter which resolved quickly with dilation.  The dilated balloon was removed.  The dual-lumen catheter was then easily advanced over the sensor wire and a second sensor wire was placed.  A 12/14 French ureteral access sheath was placed over the working wire under fluoroscopic guidance to the lower proximal ureter without difficulty.  A digital flexible ureteroscope was placed through the access sheath and advanced into the proximal ureter however narrowing of the proximal ureter distal to the stone was noted prohibiting further advancement of the ureteroscope.  Retrograde pyelogram was performed with findings as described above.    It was elected at this point to place a ureteral stent to allow for passive dilation.  The ureteral access sheath and ureteroscope were removed in tandem and the ureter showed no evidence of injury or perforation.  The wire was then backloaded through the cystoscope and a 2F/26 cm Contour ureteral stent was advance over the wire using Seldinger technique.  The bladder was then emptied and  the procedure ended.  The patient appeared to tolerate the procedure well and without complications.  After anesthetic reversal the patient was transported to the PACU in stable condition.    Plan: Will obtain a follow-up KUB early next week and discuss options of repeat ureteroscopy after passive dilation versus repeat shockwave lithotripsy.   John Giovanni, MD

## 2018-10-19 ENCOUNTER — Ambulatory Visit
Admission: RE | Admit: 2018-10-19 | Discharge: 2018-10-19 | Disposition: A | Discharge status: Home / Self Care | Payer: PRIVATE HEALTH INSURANCE | Source: Ambulatory Visit | Attending: Urology | Admitting: Urology

## 2018-10-19 DIAGNOSIS — N200 Calculus of kidney: Secondary | ICD-10-CM | POA: Insufficient documentation

## 2018-10-25 ENCOUNTER — Telehealth: Payer: Self-pay

## 2018-10-25 NOTE — Telephone Encounter (Signed)
Spoke with patient following up on after hours nurse call stating he is having severe pain with stent in place and burning with urination.  Patient today says he is much better still having some discomfort from stent.  Patient would like to know his KUB results from last week and how he is to proceed?

## 2018-10-25 NOTE — Telephone Encounter (Signed)
The ureteral fragment is still in the same location.  Since his stent has been indwelling I think the most expeditious treatment would be repeat ureteroscopy and treatment of the ureteral fragment and renal calculi.  The only other option would be shockwave lithotripsy and only the ureteral fragment could be treated.  If he desires to proceed with repeat ureteroscopy I will fill out the scheduling sheet.  Let me know if he has any questions and I will be happy to call him.

## 2018-10-26 NOTE — Telephone Encounter (Signed)
Patient notified and he states he would like to proceed with URS for treatment of stones

## 2018-10-31 ENCOUNTER — Other Ambulatory Visit: Payer: Self-pay

## 2018-10-31 ENCOUNTER — Encounter: Payer: Self-pay | Admitting: *Deleted

## 2018-10-31 ENCOUNTER — Encounter
Admission: RE | Admit: 2018-10-31 | Discharge: 2018-10-31 | Disposition: A | Discharge status: Home / Self Care | Payer: No Typology Code available for payment source | Source: Ambulatory Visit | Attending: Urology | Admitting: Urology

## 2018-10-31 DIAGNOSIS — N200 Calculus of kidney: Secondary | ICD-10-CM

## 2018-10-31 NOTE — Patient Instructions (Signed)
INSTRUCTIONS FOR SURGERY     Your surgery is scheduled for:   Tuesday, November 08, 2018     To find out your arrival time for the day of surgery,          please call [phone] between 1 pm and 3 pm on :   Monday, November 07, 2018     When you arrive for surgery, report to the [facility].       Do NOT stop on the first floor to register.    REMEMBER: Instructions that are not followed completely may result in serious medical risk,  up to and including death, or upon the discretion of your surgeon and anesthesiologist,            your surgery may need to be rescheduled.  __X__ 1. Do not eat food after midnight the night before your procedure.                    No gum, candy, lozenger, tic tacs, tums or hard candies.                  ABSOLUTELY NOTHING SOLID IN YOUR MOUTH AFTER MIDNIGHT                    You may drink unlimited clear liquids up to 2 hours before you are scheduled to arrive for surgery.                   Do not drink anything within those 2 hours unless you need to take medicine, then take the                   smallest amount you need.  Clear liquids include:  water, apple juice without pulp,                   any flavor Gatorade, Black coffee, black tea.  Sugar may be added but no dairy/ honey /lemon.                        Broth and jello is not considered a clear liquid.  __x__  2. On the morning of surgery, please brush your teeth with toothpaste and water. You may rinse with                  mouthwash if you wish but DO NOT SWALLOW TOOTHPASTE OR MOUTHWASH  __X___3. NO alcohol for 24 hours before or after surgery.  __x___ 4.  Do NOT smoke or use e-cigarettes for 24 HOURS PRIOR TO SURGERY.                      DO NOT Use any chewable tobacco products for at least 6 hours prior to surgery.  __x___ 5. If you start any new medication after this appointment and prior to surgery,  please                   Bring it with you on the day of surgery.  ___x__ 6. Notify your doctor if there is  any change in your medical condition, such as fever, infection, vomitting,                   Diarrhea or any open sores.  __x___ 7.  USE ANTIBACTERIAL SOAP as instructed, the night before surgery and the day of surgery.                   Once you have washed with this soap, do NOT use any of the following: Powders, perfumes                    or lotions. Please do not wear make up, hairpins, clips or nail polish. You MAY wear deodorant.                   Men may shave their face and neck.  Women need to shave 48 hours prior to surgery.                   DO NOT wear ANY jewelry on the day of surgery. If there are rings that are too tight to                    remove easily, please address this prior to the surgery day. Piercings need to be removed.                                                                     NO METAL ON YOUR BODY.                    Do NOT bring any valuables.  If you came to Pre-Admit testing then you will not need license,                     insurance card or credit card.  If you will be staying overnight, please either leave your things in                     the car or have your family be responsible for these items.                     [facility] IS NOT RESPONSIBLE FOR BELONGINGS OR VALUABLES.  ___X__ 8. DO NOT wear contact lenses on surgery day.  You may not have dentures,                     Hearing aides, contacts or glasses in the operating room. These items can be                    Placed in the Recovery Room to receive immediately after surgery.  __x___ 9. IF YOU ARE SCHEDULED TO GO HOME ON THE SAME DAY, YOU MUST                   Have someone to drive you home and to stay with you  for the first 24 hours.                    Have an arrangement prior to arriving on surgery day.  ___x__ 10. Take the following  medications on the morning of surgery  with a sip of water:                              1.  FLOMAX                     2.                     3.                     4.                     5.                     6.  _____ 11.  Follow any instructions provided to you by your surgeon.                        Such as enema, clear liquid bowel prep  __X__  12. STOP  ASPIRIN AS OF:  TODAY...(already stopped several months ago)                       THIS INCLUDES BC POWDERS / GOODIES POWDER  __x___ 13. STOP Anti-inflammatories as of: TODAY                      This includes IBUPROFEN / MOTRIN / ADVIL / ALEVE/ NAPROXYN                    YOU MAY TAKE TYLENOL ANY TIME PRIOR TO SURGERY.  _____ 14.  Stop supplements until after surgery.                     This includes:  N/A                 You may continue taking Vitamin B12 / Vitamin D3 but do not take on the morning of surgery.  _____ 15. Bring your CPAP machine into preop with you on the morning of surgery.  ______16. If staying overnight, please have appropriate shoes to wear to be able to walk around the unit.                   Wear clean and comfortable clothing to the hospital.

## 2018-11-01 ENCOUNTER — Other Ambulatory Visit: Payer: No Typology Code available for payment source

## 2018-11-01 DIAGNOSIS — N200 Calculus of kidney: Secondary | ICD-10-CM

## 2018-11-01 LAB — URINALYSIS, COMPLETE
Bilirubin, UA: NEGATIVE
Glucose, UA: NEGATIVE
Ketones, UA: NEGATIVE
Nitrite, UA: NEGATIVE
Protein,UA: NEGATIVE
Specific Gravity, UA: 1.01 (ref 1.005–1.030)
Urobilinogen, Ur: 0.2 mg/dL (ref 0.2–1.0)
pH, UA: 5.5 (ref 5.0–7.5)

## 2018-11-01 LAB — MICROSCOPIC EXAMINATION
Bacteria, UA: NONE SEEN
RBC, Urine: >30 /hpf — AB (ref 0–2)

## 2018-11-04 ENCOUNTER — Other Ambulatory Visit: Payer: Self-pay

## 2018-11-04 ENCOUNTER — Other Ambulatory Visit
Admission: RE | Admit: 2018-11-04 | Discharge: 2018-11-04 | Disposition: A | Discharge status: Home / Self Care | Payer: PRIVATE HEALTH INSURANCE | Source: Ambulatory Visit | Attending: Urology | Admitting: Urology

## 2018-11-04 DIAGNOSIS — Z01812 Encounter for preprocedural laboratory examination: Secondary | ICD-10-CM | POA: Diagnosis not present

## 2018-11-04 DIAGNOSIS — N201 Calculus of ureter: Secondary | ICD-10-CM | POA: Insufficient documentation

## 2018-11-04 DIAGNOSIS — N200 Calculus of kidney: Secondary | ICD-10-CM | POA: Insufficient documentation

## 2018-11-04 DIAGNOSIS — Z20828 Contact with and (suspected) exposure to other viral communicable diseases: Secondary | ICD-10-CM | POA: Diagnosis not present

## 2018-11-04 LAB — CULTURE, URINE COMPREHENSIVE

## 2018-11-05 LAB — SARS CORONAVIRUS 2 (TAT 6-24 HRS): SARS Coronavirus 2: NEGATIVE

## 2018-11-06 ENCOUNTER — Other Ambulatory Visit: Payer: Self-pay | Admitting: Urology

## 2018-11-06 MED ORDER — AMOXICILLIN 875 MG PO TABS: 875.0000 mg | ORAL_TABLET | Freq: Two times a day (BID) | ORAL | 0 refills | Status: DC

## 2018-11-07 ENCOUNTER — Other Ambulatory Visit: Payer: Self-pay | Admitting: Radiology

## 2018-11-07 ENCOUNTER — Telehealth: Payer: Self-pay | Admitting: Radiology

## 2018-11-07 DIAGNOSIS — N200 Calculus of kidney: Secondary | ICD-10-CM

## 2018-11-07 MED ORDER — LEVOFLOXACIN IN D5W 500 MG/100ML IV SOLN
500.0000 mg | INTRAVENOUS | Status: AC
  Administered 2018-11-08: 500 mg via INTRAVENOUS

## 2018-11-07 NOTE — Telephone Encounter (Signed)
-----   Message from Abbie Sons, MD sent at 11/06/2018 10:58 AM EDT ----- Urine culture with low level of bacteria most likely colonization.  Rx amoxicillin sent to pharmacy.  Have him start Monday and get 2 doses in.  Change IV antibiotic to Levaquin 500 mg preop

## 2018-11-08 ENCOUNTER — Encounter: Payer: Self-pay | Admitting: *Deleted

## 2018-11-08 ENCOUNTER — Other Ambulatory Visit: Payer: Self-pay

## 2018-11-08 ENCOUNTER — Ambulatory Visit
Admission: RE | Admit: 2018-11-08 | Discharge: 2018-11-08 | Disposition: A | Discharge status: Home / Self Care | Payer: PRIVATE HEALTH INSURANCE | Attending: Urology | Admitting: Urology

## 2018-11-08 ENCOUNTER — Encounter
Admission: RE | Disposition: A | Discharge status: Home / Self Care | Payer: Self-pay | Source: Home / Self Care | Attending: Urology

## 2018-11-08 ENCOUNTER — Ambulatory Visit: Payer: PRIVATE HEALTH INSURANCE | Admitting: Anesthesiology

## 2018-11-08 ENCOUNTER — Ambulatory Visit: Payer: PRIVATE HEALTH INSURANCE

## 2018-11-08 ENCOUNTER — Other Ambulatory Visit: Payer: Self-pay | Admitting: Urology

## 2018-11-08 ENCOUNTER — Telehealth: Payer: Self-pay | Admitting: Urology

## 2018-11-08 DIAGNOSIS — J449 Chronic obstructive pulmonary disease, unspecified: Secondary | ICD-10-CM | POA: Insufficient documentation

## 2018-11-08 DIAGNOSIS — N202 Calculus of kidney with calculus of ureter: Secondary | ICD-10-CM | POA: Insufficient documentation

## 2018-11-08 DIAGNOSIS — G4730 Sleep apnea, unspecified: Secondary | ICD-10-CM | POA: Diagnosis not present

## 2018-11-08 DIAGNOSIS — F17210 Nicotine dependence, cigarettes, uncomplicated: Secondary | ICD-10-CM | POA: Insufficient documentation

## 2018-11-08 DIAGNOSIS — N200 Calculus of kidney: Secondary | ICD-10-CM

## 2018-11-08 DIAGNOSIS — N201 Calculus of ureter: Secondary | ICD-10-CM | POA: Diagnosis present

## 2018-11-08 HISTORY — DX: Personal history of urinary calculi: Z87.442

## 2018-11-08 HISTORY — PX: CYSTOSCOPY/URETEROSCOPY/HOLMIUM LASER/STENT PLACEMENT: SHX6546

## 2018-11-08 SURGERY — CYSTOSCOPY/URETEROSCOPY/HOLMIUM LASER/STENT PLACEMENT
Anesthesia: General | Laterality: Left

## 2018-11-08 MED ORDER — FLUMAZENIL 0.5 MG/5ML IV SOLN
0.2000 mg | Freq: Once | INTRAVENOUS | Status: AC
  Administered 2018-11-08: 15:00:00 0.2 mg via INTRAVENOUS

## 2018-11-08 MED ORDER — IOHEXOL 180 MG/ML  SOLN
INTRAMUSCULAR | Status: DC | PRN
  Administered 2018-11-08: 13:00:00 10 mL

## 2018-11-08 MED ORDER — OXYBUTYNIN CHLORIDE 5 MG PO TABS: ORAL_TABLET | ORAL | 0 refills | Status: DC

## 2018-11-08 MED ORDER — FLUMAZENIL 0.5 MG/5ML IV SOLN
0.1000 mg | Freq: Once | INTRAVENOUS | Status: AC
  Administered 2018-11-08: 0.1 mg via INTRAVENOUS

## 2018-11-08 MED ORDER — LIDOCAINE HCL (CARDIAC) PF 100 MG/5ML IV SOSY
PREFILLED_SYRINGE | INTRAVENOUS | Status: DC | PRN
  Administered 2018-11-08: 60 mg via INTRAVENOUS

## 2018-11-08 MED ORDER — OXYCODONE HCL 5 MG PO TABS: 5.0000 mg | ORAL_TABLET | Freq: Once | ORAL | Status: DC | PRN

## 2018-11-08 MED ORDER — FENTANYL CITRATE (PF) 100 MCG/2ML IJ SOLN
INTRAMUSCULAR | Status: DC | PRN
  Administered 2018-11-08: 50 ug via INTRAVENOUS
  Administered 2018-11-08: 100 ug via INTRAVENOUS
  Administered 2018-11-08: 50 ug via INTRAVENOUS

## 2018-11-08 MED ORDER — ONDANSETRON HCL 4 MG/2ML IJ SOLN
INTRAMUSCULAR | Status: AC
  Filled 2018-11-08: qty 2

## 2018-11-08 MED ORDER — EPHEDRINE SULFATE 50 MG/ML IJ SOLN
INTRAMUSCULAR | Status: DC | PRN
  Administered 2018-11-08 (×2): 10 mg via INTRAVENOUS

## 2018-11-08 MED ORDER — PROPOFOL 10 MG/ML IV BOLUS
INTRAVENOUS | Status: DC | PRN
  Administered 2018-11-08: 200 mg via INTRAVENOUS

## 2018-11-08 MED ORDER — FAMOTIDINE 20 MG PO TABS
20.0000 mg | ORAL_TABLET | Freq: Once | ORAL | Status: AC
  Administered 2018-11-08: 10:00:00 20 mg via ORAL

## 2018-11-08 MED ORDER — IPRATROPIUM-ALBUTEROL 0.5-2.5 (3) MG/3ML IN SOLN
3.0000 mL | RESPIRATORY_TRACT | Status: DC
  Administered 2018-11-08: 14:00:00 3 mL via RESPIRATORY_TRACT

## 2018-11-08 MED ORDER — IPRATROPIUM-ALBUTEROL 0.5-2.5 (3) MG/3ML IN SOLN
RESPIRATORY_TRACT | Status: AC
  Administered 2018-11-08: 14:00:00 3 mL via RESPIRATORY_TRACT
  Filled 2018-11-08: qty 3

## 2018-11-08 MED ORDER — KETAMINE HCL 50 MG/ML IJ SOLN
INTRAMUSCULAR | Status: AC
  Filled 2018-11-08: qty 10

## 2018-11-08 MED ORDER — DEXAMETHASONE SODIUM PHOSPHATE 4 MG/ML IJ SOLN
INTRAMUSCULAR | Status: AC
  Administered 2018-11-08: 15:00:00 1 mg
  Filled 2018-11-08: qty 1

## 2018-11-08 MED ORDER — EPHEDRINE SULFATE 50 MG/ML IJ SOLN
INTRAMUSCULAR | Status: AC
  Filled 2018-11-08: qty 1

## 2018-11-08 MED ORDER — MIDAZOLAM HCL 2 MG/2ML IJ SOLN
INTRAMUSCULAR | Status: AC
  Filled 2018-11-08: qty 2

## 2018-11-08 MED ORDER — AMOXICILLIN 875 MG PO TABS: 875.0000 mg | ORAL_TABLET | Freq: Two times a day (BID) | ORAL | 0 refills | Status: AC

## 2018-11-08 MED ORDER — PHENYLEPHRINE HCL (PRESSORS) 10 MG/ML IV SOLN
INTRAVENOUS | Status: DC | PRN
  Administered 2018-11-08: 150 ug via INTRAVENOUS

## 2018-11-08 MED ORDER — DEXAMETHASONE SODIUM PHOSPHATE 4 MG/ML IJ SOLN
5.0000 mg | Freq: Once | INTRAMUSCULAR | Status: AC
  Administered 2018-11-08: 15:00:00 4 mg via INTRAVENOUS

## 2018-11-08 MED ORDER — HYDROMORPHONE HCL 1 MG/ML IJ SOLN
INTRAMUSCULAR | Status: AC
  Filled 2018-11-08: qty 1

## 2018-11-08 MED ORDER — FAMOTIDINE 20 MG PO TABS
ORAL_TABLET | ORAL | Status: AC
  Filled 2018-11-08: qty 1

## 2018-11-08 MED ORDER — KETAMINE HCL 50 MG/ML IJ SOLN
INTRAMUSCULAR | Status: DC | PRN
  Administered 2018-11-08: 30 mg via INTRAMUSCULAR
  Administered 2018-11-08: 20 mg via INTRAMUSCULAR

## 2018-11-08 MED ORDER — LEVOFLOXACIN IN D5W 500 MG/100ML IV SOLN
INTRAVENOUS | Status: AC
  Filled 2018-11-08: qty 100

## 2018-11-08 MED ORDER — GLYCOPYRROLATE 0.2 MG/ML IJ SOLN
INTRAMUSCULAR | Status: DC | PRN
  Administered 2018-11-08: 0.2 mg via INTRAVENOUS

## 2018-11-08 MED ORDER — LIDOCAINE HCL (PF) 2 % IJ SOLN
INTRAMUSCULAR | Status: AC
  Filled 2018-11-08: qty 10

## 2018-11-08 MED ORDER — FENTANYL CITRATE (PF) 100 MCG/2ML IJ SOLN
INTRAMUSCULAR | Status: AC
  Filled 2018-11-08: qty 2

## 2018-11-08 MED ORDER — OXYCODONE-ACETAMINOPHEN 5-325 MG PO TABS: 1.0000 | ORAL_TABLET | ORAL | 0 refills | Status: DC | PRN

## 2018-11-08 MED ORDER — DEXAMETHASONE SODIUM PHOSPHATE 10 MG/ML IJ SOLN
INTRAMUSCULAR | Status: DC | PRN
  Administered 2018-11-08: 5 mg via INTRAVENOUS

## 2018-11-08 MED ORDER — HYDROMORPHONE HCL 1 MG/ML IJ SOLN
INTRAMUSCULAR | Status: DC | PRN
  Administered 2018-11-08: .4 mg via INTRAVENOUS
  Administered 2018-11-08: .2 mg via INTRAVENOUS
  Administered 2018-11-08: .4 mg via INTRAVENOUS

## 2018-11-08 MED ORDER — MIDAZOLAM HCL 2 MG/2ML IJ SOLN
INTRAMUSCULAR | Status: DC | PRN
  Administered 2018-11-08: 2 mg via INTRAVENOUS

## 2018-11-08 MED ORDER — DEXAMETHASONE SODIUM PHOSPHATE 10 MG/ML IJ SOLN
INTRAMUSCULAR | Status: AC
  Filled 2018-11-08: qty 1

## 2018-11-08 MED ORDER — ONDANSETRON HCL 4 MG/2ML IJ SOLN
INTRAMUSCULAR | Status: DC | PRN
  Administered 2018-11-08: 4 mg via INTRAVENOUS

## 2018-11-08 MED ORDER — ONDANSETRON HCL 4 MG/2ML IJ SOLN
4.0000 mg | Freq: Once | INTRAMUSCULAR | Status: AC
  Administered 2018-11-08: 4 mg via INTRAVENOUS

## 2018-11-08 MED ORDER — LACTATED RINGERS IV SOLN
INTRAVENOUS | Status: DC
  Administered 2018-11-08: 10:00:00 via INTRAVENOUS

## 2018-11-08 MED ORDER — FENTANYL CITRATE (PF) 100 MCG/2ML IJ SOLN: 25.0000 ug | INTRAMUSCULAR | Status: DC | PRN

## 2018-11-08 MED ORDER — OXYCODONE HCL 5 MG/5ML PO SOLN: 5.0000 mg | Freq: Once | ORAL | Status: DC | PRN

## 2018-11-08 MED ORDER — FLUMAZENIL 0.5 MG/5ML IV SOLN
INTRAVENOUS | Status: AC
  Filled 2018-11-08: qty 5

## 2018-11-08 MED ORDER — PROPOFOL 10 MG/ML IV BOLUS
INTRAVENOUS | Status: AC
  Filled 2018-11-08: qty 20

## 2018-11-08 SURGICAL SUPPLY — 32 items
BAG DRAIN CYSTO-URO LG1000N (MISCELLANEOUS) ×3 IMPLANT
BASKET ZERO TIP 1.9FR (BASKET) ×2 IMPLANT
BRUSH SCRUB EZ 1% IODOPHOR (MISCELLANEOUS) ×3 IMPLANT
BSKT STON RTRVL ZERO TP 1.9FR (BASKET) ×1
CATH URETL 5X70 OPEN END (CATHETERS) IMPLANT
CNTNR SPEC 2.5X3XGRAD LEK (MISCELLANEOUS)
CONT SPEC 4OZ STER OR WHT (MISCELLANEOUS)
CONT SPEC 4OZ STRL OR WHT (MISCELLANEOUS)
CONTAINER SPEC 2.5X3XGRAD LEK (MISCELLANEOUS) IMPLANT
DRAPE UTILITY 15X26 TOWEL STRL (DRAPES) ×3 IMPLANT
FIBER LASER TRAC TIP (UROLOGICAL SUPPLIES) ×2 IMPLANT
GLOVE BIO SURGEON STRL SZ8 (GLOVE) ×3 IMPLANT
GOWN STRL REUS W/ TWL LRG LVL3 (GOWN DISPOSABLE) ×1 IMPLANT
GOWN STRL REUS W/ TWL XL LVL3 (GOWN DISPOSABLE) ×1 IMPLANT
GOWN STRL REUS W/TWL LRG LVL3 (GOWN DISPOSABLE) ×3
GOWN STRL REUS W/TWL XL LVL3 (GOWN DISPOSABLE) ×3
GUIDEWIRE STR DUAL SENSOR (WIRE) ×3 IMPLANT
INFUSOR MANOMETER BAG 3000ML (MISCELLANEOUS) ×3 IMPLANT
INTRODUCER DILATOR DOUBLE (INTRODUCER) ×2 IMPLANT
KIT TURNOVER CYSTO (KITS) ×3 IMPLANT
PACK CYSTO AR (MISCELLANEOUS) ×3 IMPLANT
SET CYSTO W/LG BORE CLAMP LF (SET/KITS/TRAYS/PACK) ×3 IMPLANT
SHEATH URETERAL 12FR 45CM (SHEATH) ×2 IMPLANT
SHEATH URETERAL 12FRX35CM (MISCELLANEOUS) IMPLANT
SOL .9 NS 3000ML IRR  AL (IV SOLUTION) ×2
SOL .9 NS 3000ML IRR AL (IV SOLUTION) ×1
SOL .9 NS 3000ML IRR UROMATIC (IV SOLUTION) ×1 IMPLANT
STENT URET 6FRX24 CONTOUR (STENTS) IMPLANT
STENT URET 6FRX26 CONTOUR (STENTS) IMPLANT
SURGILUBE 2OZ TUBE FLIPTOP (MISCELLANEOUS) ×3 IMPLANT
VALVE UROSEAL ADJ ENDO (VALVE) ×2 IMPLANT
WATER STERILE IRR 1000ML POUR (IV SOLUTION) ×3 IMPLANT

## 2018-11-08 NOTE — Anesthesia Procedure Notes (Cosign Needed)
Procedure Name: LMA Insertion Date/Time: 11/08/2018 11:43 AM Performed by: Cory Munch, RN Pre-anesthesia Checklist: Patient identified, Emergency Drugs available, Suction available and Patient being monitored Patient Re-evaluated:Patient Re-evaluated prior to induction Oxygen Delivery Method: Circle system utilized Preoxygenation: Pre-oxygenation with 100% oxygen Induction Type: IV induction Ventilation: Mask ventilation without difficulty LMA: LMA inserted LMA Size: 5.0 Tube type: Oral Number of attempts: 1 Airway Equipment and Method: Oral airway Placement Confirmation: positive ETCO2 and breath sounds checked- equal and bilateral Tube secured with: Tape Dental Injury: Teeth and Oropharynx as per pre-operative assessment

## 2018-11-08 NOTE — Telephone Encounter (Signed)
App made mailed to patient  [name] 

## 2018-11-08 NOTE — Op Note (Signed)
Preoperative diagnosis:  1.  Left ureteral calculus 2.  Left nephrolithiasis   Postoperative diagnosis:  Same  Procedure:  1. Cystoscopy 2. Left ureteroscopy and stone removal 3. Ureteroscopic laser lithotripsy 4. Left ureteral stent placement (6FR) 26 cm 5. Left retrograde pyelography with interpretation  Surgeon: Lorin Picket C. Stoioff, M.D.  Anesthesia: General  Complications: None  Intraoperative findings:  1.  Left retrograde pyelography post procedure showed no filling defects, stone fragments or contrast extravasation  EBL: Minimal  Specimens: 1. Calculus fragments for analysis   Indication: Sean Sosa is a 58 y.o. year old male status post shockwave lithotripsy of a 10 mm left UPJ calculus on 10/06/2018.  He presented to the ED on 8/25 with left renal colic and CT showed a 6 mm left proximal ureteral fragment along with nonobstructing left lower pole fragments.  There was failure of progression on a KUB.  He underwent an attempt at left ureteroscopic treatment on 8/26 however the upper ureter could not be accessed secondary to anatomy.  A 7 French/26 cm stent was placed to allow for period of stent dilation.  He presents for follow-up ureteroscopy.  After reviewing the management options for treatment, the patient elected to proceed with the above surgical procedure(s). We have discussed the potential benefits and risks of the procedure, side effects of the proposed treatment, the likelihood of the patient achieving the goals of the procedure, and any potential problems that might occur during the procedure or recuperation. Informed consent has been obtained.  Description of procedure:  The patient was taken to the operating room and general anesthesia was induced.  The patient was placed in the dorsal lithotomy position, prepped and draped in the usual sterile fashion, and preoperative antibiotics were administered. A preoperative time-out was performed.   A 21 French  cystoscope was lubricated and passed under direct vision.  The urethra was normal in caliber without stricture.  The prostate demonstrated moderate lateral lobe enlargement.  Panendoscopy was performed and the bladder mucosa showed no erythema, solid or papillary lesions.  Attention then turned to the left ureteral orifice and the indwelling stent was grasped with endoscopic forceps and brought out to the urethral meatus.  A 0.038 Sensor wire was placed to the stent and advanced to the left renal pelvis under fluoroscopic guidance without difficulty.  A dual-lumen catheter was then placed over the safety wire and a second Sensor wire was placed.  A 12/14 French ureteral access sheath was placed over the working wire under fluoroscopic guidance just below the ureteral calculus.  A digital flexible ureteroscope was placed through the access sheath and the proximal ureteral calculus was identified.      A 200 micron holmium laser fiber was placed through the ureteroscope and the stone was dusted at a setting of 0.2 J and frequency of 20 hz.  Proximal migration of this fragment was noted to a lower pole calyx.  The ureteroscope was advanced to the lower pole calyx and fragmentation was completed.  There were multiple additional fragments noted in the lower pole calyx which did not require laser treatment and were removed with a 1.9 [place] nitinol basket.  Retrograde pyelogram was performed and each calyx was sequentially examined under fluoroscopic guidance and no significant size fragments were identified.  The ureteral access sheath and ureteroscope were removed in tandem and the ureter showed no evidence of injury or perforation.  A 6 FR/26 CM stent was was placed under fluoroscopic guidance.  The wire was then  removed with an adequate stent curl noted in the renal pelvis as well as in the bladder.  The bladder was then emptied and the procedure ended.  The patient appeared to tolerate the procedure  well and without complications.  After anesthetic reversal the patient was transported to the PACU in stable condition.   Plan: He will return in approximately 1 week for cystoscopy with stent removal.  Sean Giovanni, MD

## 2018-11-08 NOTE — Progress Notes (Signed)
Updated pts wife as to pt very sleepy, and pt needs to stay until 1630 due to reversal meds given.

## 2018-11-08 NOTE — Transfer of Care (Signed)
Immediate Anesthesia Transfer of Care Note  Patient: Sean Sosa  Procedure(s) Performed: CYSTOSCOPY/URETEROSCOPY/HOLMIUM LASER/STENT PLACEMENT (Left )  Patient Location: PACU  Anesthesia Type:General  Level of Consciousness: sedated  Airway & Oxygen Therapy: Patient Spontanous Breathing and Patient connected to face mask oxygen  Post-op Assessment: Report given to RN and Post -op Vital signs reviewed and stable  Post vital signs: Reviewed and stable  Last Vitals:  Vitals Value Taken Time  BP 104/66 11/08/18 1311  Temp 37 C 11/08/18 1311  Pulse 86 11/08/18 1311  Resp 12 11/08/18 1311  SpO2 95 % 11/08/18 1311    Last Pain:  Vitals:   11/08/18 1311  TempSrc:   PainSc: Asleep      Patients Stated Pain Goal: 1 (28/00/34 9179)  Complications: No apparent anesthesia complications

## 2018-11-08 NOTE — Progress Notes (Signed)
Patient moved to recliner chair in pacu.  Patient alert and oriented, will continue to monitor oxygen level 92-96 on room Air.

## 2018-11-08 NOTE — Progress Notes (Signed)
Dr. Andree Elk in to evaluate patient.  Patient arouses easily to voice and is alert x4.  Will continue to monitor.

## 2018-11-08 NOTE — Progress Notes (Signed)
Dr. Andree Elk aware of sleepiness of patient.  May give romazicon and assess level of alertness.

## 2018-11-08 NOTE — Telephone Encounter (Signed)
-----   Message from Abbie Sons, MD sent at 11/08/2018  1:34 PM EDT ----- Please schedule cystoscopy with stent removal approximately 7-10 days.  Thanks

## 2018-11-08 NOTE — Anesthesia Post-op Follow-up Note (Signed)
Anesthesia QCDR form completed.        

## 2018-11-08 NOTE — Discharge Instructions (Signed)
AMBULATORY SURGERY  DISCHARGE INSTRUCTIONS   1) The drugs that you were given will stay in your system until tomorrow so for the next 24 hours you should not:  A) Drive an automobile B) Make any legal decisions C) Drink any alcoholic beverage   2) You may resume regular meals tomorrow.  Today it is better to start with liquids and gradually work up to solid foods.  You may eat anything you prefer, but it is better to start with liquids, then soup and crackers, and gradually work up to solid foods.   3) Please notify your doctor immediately if you have any unusual bleeding, trouble breathing, redness and pain at the surgery site, drainage, fever, or pain not relieved by medication.    4) Additional Instructions:        Please contact your physician with any problems or Same Day Surgery at [phone], Monday through Friday 6 am to 4 pm, or [place] at [facility] number at [phone].DISCHARGE INSTRUCTIONS FOR KIDNEY STONE/URETERAL STENT   MEDICATIONS:  1. Resume all your other meds from home.  2.  AZO (over-the-counter) can help with the burning/stinging when you urinate. 3.  You may take oxycodone and oxybutynin for pain/stent irritation if needed. 4.  Pick up Rx amoxicillin for post procedure antibiotic  ACTIVITY:  1. May resume regular activities in 24 hours. 2. No driving while on narcotic pain medications  3. Drink plenty of water  4. Continue to walk at home - you can still get blood clots when you are at home, so keep active, but don't over do it.  5. May return to work/school tomorrow or when you feel ready   BATHING:  1. You can shower.  SIGNS/SYMPTOMS TO CALL:  Please call us if you have a fever greater than 101.5, uncontrolled nausea/vomiting, uncontrolled pain, dizziness, unable to urinate, excessively bloody urine, chest pain, shortness of breath, leg swelling, leg pain, or any other concerns or questions.   Urinary frequency, urgency and bladder  spasms are common postoperatively as well as blood in the urine.  You can reach [place] at [phone].   FOLLOW-UP:  1.  If follow-up is not scheduled prior to discharge you will be contacted by our office.

## 2018-11-08 NOTE — Interval H&P Note (Signed)
History and Physical Interval Note: Mr. Ofarrell has obstructing left proximal ureteral calculus after shockwave lithotripsy as well as a left nephrolithiasis.  He is status post an attempt at left ureteroscopic stone removal on 10/12/2018 however due to ureteral anatomy in the upper tract could not be accessed.  He has had an indwelling stent since that time.  He presents for follow-up ureteroscopy after a period of stent dilation.  All questions were answered and he desires to proceed.  11/08/2018 11:26 AM  Christia Reading Dominica Severin  has presented today for surgery, with the diagnosis of left ureteral calculus,left nephrolithiasis.  The various methods of treatment have been discussed with the patient and family. After consideration of risks, benefits and other options for treatment, the patient has consented to  Procedure(s): CYSTOSCOPY/URETEROSCOPY/HOLMIUM LASER/STENT PLACEMENT (Left) as a surgical intervention.  The patient's history has been reviewed, patient examined, no change in status, stable for surgery.  I have reviewed the patient's chart and labs.  Questions were answered to the patient's satisfaction.     [place]

## 2018-11-08 NOTE — Progress Notes (Signed)
Dr. Ronelle Nigh ok with moving patient on to post op to get ready for discharge.

## 2018-11-08 NOTE — Anesthesia Preprocedure Evaluation (Addendum)
Anesthesia Evaluation  Patient identified by MRN, date of birth, ID band Patient awake    Reviewed: Allergy & Precautions, H&P , NPO status , Patient's Chart, lab work & pertinent test results  History of Anesthesia Complications Negative for: history of anesthetic complications  Airway Mallampati: II  TM Distance: >3 FB Neck ROM: full    Dental  (+) Chipped, Poor Dentition, Missing   Pulmonary sleep apnea , COPD, Current Smoker and Patient abstained from smoking.,           Cardiovascular Exercise Tolerance: Good hypertension, (-) angina(-) Past MI and (-) DOE      Neuro/Psych negative neurological ROS  negative psych ROS   GI/Hepatic negative GI ROS, Neg liver ROS,   Endo/Other  negative endocrine ROS  Renal/GU Renal disease     Musculoskeletal   Abdominal   Peds  Hematology negative hematology ROS (+)   Anesthesia Other Findings Past Medical History: No date: History of kidney stones No date: Hypercholesteremia No date: Nephrolithiasis No date: Sleep apnea     Comment:  Patient reports sleep study was neg.  Past Surgical History: 10/12/2018: CYSTOSCOPY W/ RETROGRADES; Left     Comment:  Procedure: CYSTOSCOPY WITH RETROGRADE PYELOGRAM;                Surgeon: Abbie Sons, MD;  Location: [facility] ORS;                Service: Urology;  Laterality: Left; 10/12/2018: CYSTOSCOPY/URETEROSCOPY/HOLMIUM LASER/STENT PLACEMENT; Left     Comment:  Procedure: CYSTOSCOPY/URETEROSCOPY/STENT PLACEMENT;                Surgeon: Abbie Sons, MD;  Location: [facility] OR[facility];                Service: Urology;  Laterality: Left; 10/06/2018: EXTRACORPOREAL SHOCK WAVE LITHOTRIPSY; Left     Comment:  Procedure: EXTRACORPOREAL SHOCK WAVE LITHOTRIPSY (ESWL);              Surgeon: Abbie Sons, MD;  Location: [facility] ORS;                Service: Urology;  Laterality: Left; 1999: HAND SURGERY; Left     Comment:  circular saw cut tendons  and ligaments 1970's: HERNIA REPAIR  BMI    Body Mass Index: 35.62 kg/m      Reproductive/Obstetrics negative OB ROS                             Anesthesia Physical Anesthesia Plan  ASA: III  Anesthesia Plan: General LMA   Post-op Pain Management:    Induction: Intravenous  PONV Risk Score and Plan: Ondansetron, Dexamethasone, Midazolam and Treatment may vary due to age or medical condition  Airway Management Planned: LMA  Additional Equipment:   Intra-op Plan:   Post-operative Plan: Extubation in OR  Informed Consent: I have reviewed the patients History and Physical, chart, labs and discussed the procedure including the risks, benefits and alternatives for the proposed anesthesia with the patient or authorized representative who has indicated his/her understanding and acceptance.     Dental Advisory Given  Plan Discussed with: Anesthesiologist, CRNA and Surgeon  Anesthesia Plan Comments: (Patient consented for risks of anesthesia including but not limited to:  - adverse reactions to medications - damage to teeth, lips or other oral mucosa - sore throat or hoarseness - Damage to heart, brain, lungs or loss of life  Patient  voiced understanding.)       Anesthesia Quick Evaluation

## 2018-11-09 ENCOUNTER — Encounter: Payer: Self-pay | Admitting: Urology

## 2018-11-09 NOTE — Anesthesia Postprocedure Evaluation (Signed)
Anesthesia Post Note  Patient: Sean Sosa  Procedure(s) Performed: CYSTOSCOPY/URETEROSCOPY/HOLMIUM LASER/STENT PLACEMENT (Left )  Patient location during evaluation: PACU Anesthesia Type: General Level of consciousness: awake and alert and oriented Pain management: pain level controlled Vital Signs Assessment: post-procedure vital signs reviewed and stable Respiratory status: spontaneous breathing, nonlabored ventilation and respiratory function stable Cardiovascular status: blood pressure returned to baseline and stable Postop Assessment: no signs of nausea or vomiting Anesthetic complications: no     Last Vitals:  Vitals:   11/08/18 1546 11/08/18 1649  BP: (!) 145/87 (!) 146/90  Pulse: 72 70  Resp: 20 18  Temp: (!) 36.1 C   SpO2: 95% 96%    Last Pain:  Vitals:   11/09/18 0833  TempSrc:   PainSc: 0-No pain                 Morenike Cuff

## 2018-11-15 LAB — CALCULI, WITH PHOTOGRAPH (CLINICAL LAB)
Calcium Oxalate Monohydrate: 100 %
Weight Calculi: 97 mg

## 2018-11-18 ENCOUNTER — Other Ambulatory Visit: Payer: Self-pay

## 2018-11-18 ENCOUNTER — Ambulatory Visit (INDEPENDENT_AMBULATORY_CARE_PROVIDER_SITE_OTHER): Payer: No Typology Code available for payment source | Admitting: Urology

## 2018-11-18 ENCOUNTER — Encounter: Payer: Self-pay | Admitting: Urology

## 2018-11-18 VITALS — BP 133/88 | HR 99 | Ht 73.0 in | Wt 266.2 lb

## 2018-11-18 DIAGNOSIS — N200 Calculus of kidney: Secondary | ICD-10-CM | POA: Diagnosis not present

## 2018-11-18 LAB — URINALYSIS, COMPLETE
Bilirubin, UA: NEGATIVE
Glucose, UA: NEGATIVE
Ketones, UA: NEGATIVE
Nitrite, UA: NEGATIVE
Specific Gravity, UA: >1.03 — ABNORMAL HIGH (ref 1.005–1.030)
Urobilinogen, Ur: 0.2 mg/dL (ref 0.2–1.0)
pH, UA: 5.5 (ref 5.0–7.5)

## 2018-11-18 LAB — MICROSCOPIC EXAMINATION: Bacteria, UA: NONE SEEN

## 2018-11-18 MED ORDER — LIDOCAINE HCL URETHRAL/MUCOSAL 2 % EX GEL
1.0000 "application " | Freq: Once | CUTANEOUS | Status: AC
  Administered 2018-11-18: 1 via URETHRAL

## 2018-11-18 MED ORDER — CIPROFLOXACIN HCL 500 MG PO TABS
500.0000 mg | ORAL_TABLET | Freq: Once | ORAL | Status: AC
  Administered 2018-11-18: 500 mg via ORAL

## 2018-11-18 NOTE — Progress Notes (Signed)
Indications: Patient is 58 y.o., male who recently underwent ureteroscopic removal of left ureteral/renal calculi.  He had no postoperative problems.  He has moderate stent irritative symptoms.  The patient is presenting today for stent removal.  Procedure:  Flexible Cystoscopy with stent removal (29937)  Timeout was performed and the correct patient, procedure and participants were identified.    Description:  The patient was prepped and draped in the usual sterile fashion. Flexible cystosopy was performed.  The stent was visualized, grasped, and removed intact without difficulty. The patient tolerated the procedure well.  A single dose of oral antibiotics was given.  Complications:  None  Plan:  -He will instructed to call for flank pain/fever for stent removal -Follow-up visit with KUB 4 months -Recommend proceeding with a metabolic evaluation to include blood work and a 24-hour urine study.   John Giovanni, MD

## 2018-11-20 ENCOUNTER — Encounter: Payer: Self-pay | Admitting: Urology

## 2019-03-23 ENCOUNTER — Ambulatory Visit: Payer: No Typology Code available for payment source | Admitting: Urology

## 2019-03-23 ENCOUNTER — Encounter: Payer: Self-pay | Admitting: Urology

## 2019-07-27 ENCOUNTER — Telehealth: Payer: Self-pay | Admitting: Urology

## 2019-07-27 NOTE — Telephone Encounter (Signed)
LMOM for patient to return call. Needs a follow up visit with KUB prior.

## 2019-07-27 NOTE — Telephone Encounter (Signed)
Would you call Sean Sosa and have him schedule a follow up visit for his stones, with KUB prior?

## 2019-07-28 NOTE — Telephone Encounter (Signed)
LMOM for patient to call back.

## 2019-07-29 ENCOUNTER — Other Ambulatory Visit: Payer: Self-pay | Admitting: Urology

## 2019-07-31 NOTE — Telephone Encounter (Signed)
Patient called and scheduled appointment.

## 2019-08-08 ENCOUNTER — Ambulatory Visit (INDEPENDENT_AMBULATORY_CARE_PROVIDER_SITE_OTHER): Payer: No Typology Code available for payment source | Admitting: Urology

## 2019-08-08 ENCOUNTER — Encounter: Payer: Self-pay | Admitting: Urology

## 2019-08-08 ENCOUNTER — Ambulatory Visit
Admission: RE | Admit: 2019-08-08 | Discharge: 2019-08-08 | Disposition: A | Discharge status: Home / Self Care | Payer: No Typology Code available for payment source | Attending: Urology | Admitting: Urology

## 2019-08-08 ENCOUNTER — Other Ambulatory Visit: Payer: Self-pay

## 2019-08-08 ENCOUNTER — Ambulatory Visit
Admission: RE | Admit: 2019-08-08 | Discharge: 2019-08-08 | Disposition: A | Discharge status: Home / Self Care | Payer: No Typology Code available for payment source | Source: Ambulatory Visit | Attending: Urology | Admitting: Urology

## 2019-08-08 DIAGNOSIS — N401 Enlarged prostate with lower urinary tract symptoms: Secondary | ICD-10-CM

## 2019-08-08 DIAGNOSIS — N200 Calculus of kidney: Secondary | ICD-10-CM

## 2019-08-08 DIAGNOSIS — N138 Other obstructive and reflux uropathy: Secondary | ICD-10-CM

## 2019-08-08 DIAGNOSIS — N529 Male erectile dysfunction, unspecified: Secondary | ICD-10-CM | POA: Diagnosis not present

## 2019-08-08 LAB — BLADDER SCAN AMB NON-IMAGING: Scan Result: 44

## 2019-08-08 MED ORDER — SILDENAFIL CITRATE 100 MG PO TABS: 100.0000 mg | ORAL_TABLET | Freq: Every day | ORAL | 3 refills | Status: DC | PRN

## 2019-08-08 MED ORDER — TAMSULOSIN HCL 0.4 MG PO CAPS: 0.4000 mg | ORAL_CAPSULE | Freq: Every day | ORAL | 3 refills | Status: DC

## 2019-08-08 NOTE — Patient Instructions (Signed)

## 2019-08-08 NOTE — Progress Notes (Signed)
07/18/19 10:04 PM   Sean Sosa Sean Sosa 1960/06/19 [id number]  Referring provider: Fayrene Helper, NP [address] [place],  [place] [address] Chief Complaint  Patient presents with  . Nephrolithiasis    HPI: Sean Sosa is a 59 y.o. male with nephrolithiasis with BPH with LUTS who presents today for a follow up.  Nephrolithiasis CT Renal stone study 12/12/2014 7 mm right UVJ calculus resulting in moderate right hydroureteronephrosis and right perinephric stranding.  Nonobstructing, 6 mm left renal calculus. Spontaneous passage. RUS 01/18/2015 Neither kidney exhibits hydronephrosis today. Bilateral ureteral jets are observed in the bladder.  There is a stable 6 mm diameter nonobstructing lower pole stone on the left. KUB 08/08/2018 Probable large left renal calculus. No evidence of bowel obstruction or ileus. Contrast CT 10/02/2018 1 cm left UPJ calculus without hydronephrosis. KUB 10/06/2018 Left renal pelvic stone unchanged. Left ESWL 10/06/2018 CT Renal stone study 10/11/2018 8 x 6 mm stone in the upper left ureter with left hydronephrosis, perinephric stranding, and expansive renal low-density. At least 4 left renal calculi measuring up to 6 mm. Underwent left stent placement with Dr. Lonna Cobb on 10/12/2018 KUB 10/19/2018 Left renal and ureteral calculi with ureteral stent in place. Underwent left URS with Dr. Lonna Cobb on 11/08/2018 KUB 08/08/2019 no calculi seen.    Stone composition 100% calcium oxalate monohydrate.  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  He did not have his blood drawn at the same time.    UA 3-10 RBC's.  Cysto during URS in 10/2018 NED.  He does not complain of renal colic, gross hematuria or passage of fragments.   BPH WITH LUTS  (prostate and/or bladder) IPSS score: 6/3 PVR: 44 mL  Previous score: 14/6  Previous PVR: 8 mL  Major complaint(s):  None  Denies  any dysuria, hematuria or suprapubic pain.   Currently taking: Tamsulosin 0.4 mg daily  His has had cysto with Dr. Lonna Cobb on 10/12/2018 noted the urethra was normal in caliber without stricture.  The prostate demonstrated mild lateral lobe enlargement.  Panendoscopy was performed and the bladder mucosa showed no erythema, solid or papillary lesions.   Denies any recent fevers, chills, nausea or vomiting.  He does not have a family history of PCa   IPSS    Row Name 08/08/19 1400         International Prostate Symptom Score   How often have you had the sensation of not emptying your bladder? Not at All     How often have you had to urinate less than every two hours? Less than half the time     How often have you found you stopped and started again several times when you urinated? Not at All     How often have you found it difficult to postpone urination? Less than 1 in 5 times     How often have you had a weak urinary stream? Less than half the time     How often have you had to strain to start urination? Not at All     How many times did you typically get up at night to urinate? 1 Time     Total IPSS Score 6       Quality of Life due to urinary symptoms   If you were to spend the rest of your life with your urinary condition just the way it is now how would you feel about  that? Mixed            Score:  1-7 Mild 8-19 Moderate 20-35 Severe  PMH Past Medical History:  Diagnosis Date  . History of kidney stones   . Hypercholesteremia   . Nephrolithiasis   . Sleep apnea    Patient reports sleep study was neg.    Surgical History: Past Surgical History:  Procedure Laterality Date  . CYSTOSCOPY W/ RETROGRADES Left 10/12/2018   Procedure: CYSTOSCOPY WITH RETROGRADE PYELOGRAM;  Surgeon: Riki Altes, MD;  Location: [facility];  Service: Urology;  Laterality: Left;  . CYSTOSCOPY/URETEROSCOPY/HOLMIUM LASER/STENT PLACEMENT Left 10/12/2018   Procedure:  CYSTOSCOPY/URETEROSCOPY/STENT PLACEMENT;  Surgeon: Riki Altes, MD;  Location: [facility];  Service: Urology;  Laterality: Left;  . CYSTOSCOPY/URETEROSCOPY/HOLMIUM LASER/STENT PLACEMENT Left 11/08/2018   Procedure: CYSTOSCOPY/URETEROSCOPY/HOLMIUM LASER/STENT PLACEMENT;  Surgeon: Riki Altes, MD;  Location: [facility];  Service: Urology;  Laterality: Left;  . EXTRACORPOREAL SHOCK WAVE LITHOTRIPSY Left 10/06/2018   Procedure: EXTRACORPOREAL SHOCK WAVE LITHOTRIPSY (ESWL);  Surgeon: Riki Altes, MD;  Location: [facility];  Service: Urology;  Laterality: Left;  . HAND SURGERY Left 1999   circular saw cut tendons and ligaments  . HERNIA REPAIR  1970's    Home Medications:  Allergies as of 08/08/2019   No Known Allergies     Medication List       Accurate as of August 08, 2019 10:04 PM. If you have any questions, ask your nurse or doctor.        aspirin EC 81 MG tablet Take 81 mg by mouth every evening.   ondansetron 4 MG disintegrating tablet Commonly known as: Zofran ODT Take 1 tablet (4 mg total) by mouth every 8 (eight) hours as needed for nausea or vomiting.   oxybutynin 5 MG tablet Commonly known as: DITROPAN 1 tab tid prn frequency,urgency, bladder spasm   oxyCODONE-acetaminophen 5-325 MG tablet Commonly known as: Percocet Take 1 tablet by mouth every 4 (four) hours as needed for severe pain.   sildenafil 100 MG tablet Commonly known as: VIAGRA Take 1 tablet (100 mg total) by mouth daily as needed.   tamsulosin 0.4 MG Caps capsule Commonly known as: FLOMAX Take 1 capsule (0.4 mg total) by mouth daily.       Allergies: No Known Allergies  Family History: Family History  Problem Relation Age of Onset  . Prostate cancer Father   . Hematuria Father     Social History:  reports that he has been smoking cigarettes. He has been smoking about 1.50 packs per day. He has never used smokeless tobacco. He reports that he does not drink alcohol and does not use  drugs.   Physical Exam: BP 135/87   Pulse 75   Ht 6\' 1"  (1.854 m)   Wt 285 lb (129.3 kg)   BMI 37.60 kg/m   Constitutional:  Well nourished. Alert and oriented, No acute distress. HEENT: [place] AT, mask in place.  Trachea midline Cardiovascular: No clubbing, cyanosis, or edema. Respiratory: Normal respiratory effort, no increased work of breathing. GI: Abdomen is soft, non tender, non distended, no abdominal masses. Liver and spleen not palpable.  No hernias appreciated.  Stool sample for occult testing is not indicated.   GU: No CVA tenderness.  No bladder fullness or masses.  Patient with circumcised phallus.  Urethral meatus is patent.  No penile discharge. No penile lesions or rashes. Scrotum without lesions, cysts, rashes and/or edema.  Testicles are located scrotally bilaterally. No masses are  appreciated in the testicles. Left and right epididymis are normal. Rectal: Patient with  normal sphincter tone. Anus and perineum without scarring or rashes. No rectal masses are appreciated. Prostate is approximately 55 grams, no nodules are appreciated. Seminal vesicles could not be palpated.  Skin: No rashes, bruises or suspicious lesions. Lymph: No inguinal adenopathy. Neurologic: Grossly intact, no focal deficits, moving all 4 extremities. Psychiatric: Normal mood and affect.   Laboratory Data: Lab Results  Component Value Date   CREATININE 1.27 (H) 10/11/2018   Urinalysis Component     Latest Ref Rng & Units 08/08/2019  Specific Gravity, UA     1.005 - 1.030 >1.030 (H)  pH, UA     5.0 - 7.5 5.0  Color, UA     Yellow Yellow  Appearance Ur     Clear Hazy (A)  Leukocytes,UA     Negative Negative  Protein,UA     Negative/Trace 2+ (A)  Glucose, UA     Negative Negative  Ketones, UA     Negative Negative  RBC, UA     Negative 1+ (A)  Bilirubin, UA     Negative Negative  Urobilinogen, Ur     0.2 - 1.0 mg/dL 0.2  Nitrite, UA     Negative Negative  Microscopic Examination       See below:   Component     Latest Ref Rng & Units 08/08/2019  WBC, UA     0 - 5 /hpf 0-5  RBC     0 - 2 /hpf 3-10 (A)  Epithelial Cells (non renal)     0 - 10 /hpf 0-10  Bacteria, UA     None seen/Few None seen   I have reviewed the labs.    Pertinent Imaging: Results for KHALEE, MAZO (MRN [id number]) as of 08/08/2019 22:07  Ref. Range 08/08/2019 14:58  Scan Result Unknown 44   I have independently reviewed the films  Assessment & Plan:    1. BPH with LUTS IPSS score is 6/3, it is improving Continue conservative management, avoiding bladder irritants and timed voiding's Continue tamsulosin 0.4 mg daily - refills given RTC in 12 months for IPSS, PSA, PVR and exam - if PSA is stable  2. Nephrolithiasis  Patient completed 24 hour metabolic workup in [id number] but unfortunately he sent urine directly to [place] instead of bringing it here so no blood work was completed. 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 ED Patient requests refill on his sildenafil RTC in 12 months for [facility] and exam   [place] [address] [place], [place] [address] [phone]  I, Joneen Boers Peace, am acting as a Education administrator for [employer]. I have reviewed the above documentation for accuracy and completeness, and I agree with the above.    Sean Council, PA-C

## 2019-08-09 ENCOUNTER — Telehealth: Payer: Self-pay | Admitting: Family Medicine

## 2019-08-09 LAB — PSA: Prostate Specific Ag, Serum: 1.2 ng/mL (ref 0.0–4.0)

## 2019-08-09 NOTE — Telephone Encounter (Signed)
Patient notified and voiced understanding.

## 2019-08-09 NOTE — Telephone Encounter (Signed)
-----   Message from Harle Battiest, PA-C sent at 08/09/2019 12:53 PM EDT ----- Please let Mr. Birman know that his PSA returned stable at 1.2 and we will see him in August.

## 2019-08-10 LAB — MICROSCOPIC EXAMINATION: Bacteria, UA: NONE SEEN

## 2019-08-10 LAB — URINALYSIS, COMPLETE
Bilirubin, UA: NEGATIVE
Glucose, UA: NEGATIVE
Ketones, UA: NEGATIVE
Leukocytes,UA: NEGATIVE
Nitrite, UA: NEGATIVE
Specific Gravity, UA: >1.03 — ABNORMAL HIGH (ref 1.005–1.030)
Urobilinogen, Ur: 0.2 mg/dL (ref 0.2–1.0)
pH, UA: 5 (ref 5.0–7.5)

## 2019-08-11 ENCOUNTER — Other Ambulatory Visit: Payer: Self-pay | Admitting: Urology

## 2019-09-27 NOTE — Progress Notes (Signed)
07/18/19 4:13 PM   Sean Sosa 07-04-1960 [id number]  Referring provider: Fayrene Helper, NP [address] [place],  [place] [address] Chief Complaint  Patient presents with  . Follow-up    HPI: Sean Sosa is a 59 y.o. male with nephrolithiasis with BPH with LUTS who presents today for repeat LithoLink results.    Nephrolithiasis CT Renal stone study 12/12/2014 7 mm right UVJ calculus resulting in moderate right hydroureteronephrosis and right perinephric stranding.  Nonobstructing, 6 mm left renal calculus. Spontaneous passage. RUS 01/18/2015 Neither kidney exhibits hydronephrosis today. Bilateral ureteral jets are observed in the bladder.  There is a stable 6 mm diameter nonobstructing lower pole stone on the left. KUB 08/08/2018 Probable large left renal calculus. No evidence of bowel obstruction or ileus. Contrast CT 10/02/2018 1 cm left UPJ calculus without hydronephrosis. KUB 10/06/2018 Left renal pelvic stone unchanged. Left ESWL 10/06/2018 CT Renal stone study 10/11/2018 8 x 6 mm stone in the upper left ureter with left hydronephrosis, perinephric stranding, and expansive renal low-density. At least 4 left renal calculi measuring up to 6 mm. Underwent left stent placement with Dr. Lonna Sosa on 10/12/2018 KUB 10/19/2018 Left renal and ureteral calculi with ureteral stent in place. Underwent left URS with Dr. Lonna Sosa on 11/08/2018 KUB 08/08/2019 no calculi seen.    Stone composition 100% calcium oxalate monohydrate.  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  He did not have his blood drawn at the same time.    Litholink results noted the excessive variation in creatinine excretion suggesting a discrepancy in the collection process.  He states that he followed the directions on the package and shipped it so again no blood work was drawn.     PMH Past Medical History:    Diagnosis Date  . History of kidney stones   . Hypercholesteremia   . Nephrolithiasis   . Sleep apnea    Patient reports sleep study was neg.    Surgical History: Past Surgical History:  Procedure Laterality Date  . CYSTOSCOPY W/ RETROGRADES Left 10/12/2018   Procedure: CYSTOSCOPY WITH RETROGRADE PYELOGRAM;  Surgeon: Sean Altes, MD;  Location: [facility];  Service: Urology;  Laterality: Left;  . CYSTOSCOPY/URETEROSCOPY/HOLMIUM LASER/STENT PLACEMENT Left 10/12/2018   Procedure: CYSTOSCOPY/URETEROSCOPY/STENT PLACEMENT;  Surgeon: Sean Altes, MD;  Location: [facility];  Service: Urology;  Laterality: Left;  . CYSTOSCOPY/URETEROSCOPY/HOLMIUM LASER/STENT PLACEMENT Left 11/08/2018   Procedure: CYSTOSCOPY/URETEROSCOPY/HOLMIUM LASER/STENT PLACEMENT;  Surgeon: Sean Altes, MD;  Location: [facility];  Service: Urology;  Laterality: Left;  . EXTRACORPOREAL SHOCK WAVE LITHOTRIPSY Left 10/06/2018   Procedure: EXTRACORPOREAL SHOCK WAVE LITHOTRIPSY (ESWL);  Surgeon: Sean Altes, MD;  Location: [facility];  Service: Urology;  Laterality: Left;  . HAND SURGERY Left 1999   circular saw cut tendons and ligaments  . HERNIA REPAIR  1970's    Home Medications:  Allergies as of 09/28/2019   No Known Allergies     Medication List       Accurate as of September 28, 2019 11:59 PM. If you have any questions, ask your nurse or doctor.        aspirin EC 81 MG tablet Take 81 mg by mouth every evening.   ondansetron 4 MG disintegrating tablet Commonly known as: Zofran ODT Take 1 tablet (4 mg total) by mouth every 8 (eight) hours as needed for nausea or vomiting.   oxybutynin 5 MG tablet Commonly known as: DITROPAN  1 tab tid prn frequency,urgency, bladder spasm   oxyCODONE-acetaminophen 5-325 MG tablet Commonly known as: Percocet Take 1 tablet by mouth every 4 (four) hours as needed for severe pain.   sildenafil 100 MG tablet Commonly known as: VIAGRA Take 1 tablet (100 mg total) by mouth  daily as needed.   tamsulosin 0.4 MG Caps capsule Commonly known as: FLOMAX Take 1 capsule (0.4 mg total) by mouth daily.       Allergies: No Known Allergies  Family History: Family History  Problem Relation Age of Onset  . Prostate cancer Father   . Hematuria Father     Social History:  reports that he has been smoking cigarettes. He has been smoking about 1.50 packs per day. He has never used smokeless tobacco. He reports that he does not drink alcohol and does not use drugs.   Physical Exam: BP (!) 144/89   Pulse 80   Ht 6\' 1"  (1.854 m)   Wt 295 lb (133.8 kg)   BMI 38.92 kg/m   Constitutional:  Well nourished. Alert and oriented, No acute distress. HEENT: [place] AT, mask in place.  Trachea midline Cardiovascular: No clubbing, cyanosis, or edema. Respiratory: Normal respiratory effort, no increased work of breathing. Neurologic: Grossly intact, no focal deficits, moving all 4 extremities. Psychiatric: Normal mood and affect.   Laboratory Data: Lab Results  Component Value Date   CREATININE 0.88 09/28/2019   Urinalysis Component     Latest Ref Rng & Units 08/08/2019  Specific Gravity, UA     1.005 - 1.030 >1.030 (H)  pH, UA     5.0 - 7.5 5.0  Color, UA     Yellow Yellow  Appearance Ur     Clear Hazy (A)  Leukocytes,UA     Negative Negative  Protein,UA     Negative/Trace 2+ (A)  Glucose, UA     Negative Negative  Ketones, UA     Negative Negative  RBC, UA     Negative 1+ (A)  Bilirubin, UA     Negative Negative  Urobilinogen, Ur     0.2 - 1.0 mg/dL 0.2  Nitrite, UA     Negative Negative  Microscopic Examination      See below:   Component     Latest Ref Rng & Units 08/08/2019  WBC, UA     0 - 5 /hpf 0-5  RBC     0 - 2 /hpf 3-10 (A)  Epithelial Cells (non renal)     0 - 10 /hpf 0-10  Bacteria, UA     None seen/Few None seen   I have reviewed the labs.   Pertinent Imaging: Results for Sean, Sosa (MRN Sean Sosa) as of 08/08/2019  22:07  Ref. Range 08/08/2019 14:58  Scan Result Unknown 44     Assessment & Plan:    1. Nephrolithiasis  Litho link still inconclusive.  Will obtain blood work today to try to fish out what may be contributing to his stone formation.      [facility] [address] [place], [place] [phone]   [phone], PA-C  I spent 15 minutes on the day of the encounter to include pre-visit record review, face-to-face time with the patient, and post-visit ordering of tests.

## 2019-09-28 ENCOUNTER — Other Ambulatory Visit: Payer: Self-pay

## 2019-09-28 ENCOUNTER — Ambulatory Visit (INDEPENDENT_AMBULATORY_CARE_PROVIDER_SITE_OTHER): Payer: No Typology Code available for payment source | Admitting: Urology

## 2019-09-28 VITALS — BP 144/89 | HR 80 | Ht 73.0 in | Wt 295.0 lb

## 2019-09-28 DIAGNOSIS — N200 Calculus of kidney: Secondary | ICD-10-CM | POA: Diagnosis not present

## 2019-09-29 LAB — BASIC METABOLIC PANEL
BUN/Creatinine Ratio: 15 (ref 9–20)
BUN: 13 mg/dL (ref 6–24)
CO2: 26 mmol/L (ref 20–29)
Calcium: 9.4 mg/dL (ref 8.7–10.2)
Chloride: 106 mmol/L (ref 96–106)
Creatinine, Ser: 0.88 mg/dL (ref 0.76–1.27)
GFR calc Af Amer: 109 mL/min/{1.73_m2} (ref >59)
GFR calc non Af Amer: 94 mL/min/{1.73_m2} (ref >59)
Glucose: 94 mg/dL (ref 65–99)
Potassium: 5.6 mmol/L — ABNORMAL HIGH (ref 3.5–5.2)
Sodium: 144 mmol/L (ref 134–144)

## 2019-09-29 LAB — PTH, INTACT AND CALCIUM: PTH: 45 pg/mL (ref 15–65)

## 2019-09-29 LAB — URIC ACID: Uric Acid: 6 mg/dL (ref 3.8–8.4)

## 2019-09-29 LAB — MAGNESIUM: Magnesium: 2.4 mg/dL — ABNORMAL HIGH (ref 1.6–2.3)

## 2019-10-01 ENCOUNTER — Other Ambulatory Visit: Payer: Self-pay | Admitting: Urology

## 2019-10-01 MED ORDER — FUROSEMIDE 20 MG PO TABS: 20.0000 mg | ORAL_TABLET | Freq: Every day | ORAL | 0 refills | Status: DC

## 2019-10-01 NOTE — Progress Notes (Signed)
Patient prescribed Lasix 20 mg due to elevated potassium level.

## 2019-10-02 ENCOUNTER — Telehealth: Payer: Self-pay | Admitting: Family Medicine

## 2019-10-02 NOTE — Telephone Encounter (Signed)
-----   Message from Harle Battiest, PA-C sent at 10/01/2019  4:32 PM EDT ----- Please let Sean Sosa know that his potassium is high and he needs to decrease his LithoLyte to one packet daily and we need to repeat his potassium in one week.

## 2019-10-02 NOTE — Telephone Encounter (Signed)
Carollee Herter spoke patient personally and patient states he is to contact his PCP per Carollee Herter.

## 2019-10-05 ENCOUNTER — Telehealth: Payer: Self-pay | Admitting: Urology

## 2019-10-05 NOTE — Telephone Encounter (Signed)
I spoke with Sean Sosa and he has started the Lasix and he is seeing his PCP Monday.

## 2019-10-05 NOTE — Telephone Encounter (Signed)
I want Mr. Breighner to have his potassium rechecked with [place] tomorrow.  It was too high.  He should of started the Lasix on Monday.

## 2019-10-10 ENCOUNTER — Telehealth: Payer: Self-pay | Admitting: Urology

## 2019-10-10 ENCOUNTER — Encounter: Payer: Self-pay | Admitting: Urology

## 2019-10-10 NOTE — Telephone Encounter (Signed)
Please have Sean Sosa schedule an appointment for next year for hematuria and nephrolithiasis.

## 2019-10-12 NOTE — Telephone Encounter (Signed)
Appointment mailed to patient 

## 2019-10-25 ENCOUNTER — Telehealth: Payer: Self-pay | Admitting: Family Medicine

## 2019-10-25 NOTE — Telephone Encounter (Signed)
LMOM for patient to return call and let us know if he has seen the NP.

## 2019-10-25 NOTE — Telephone Encounter (Signed)
-----   Message from Harle Battiest, PA-C sent at 10/24/2019  8:30 PM EDT ----- Would you check with Sean Sosa and see if he was able to follow up with Orson Eva, NP and can we get the labs from that follow up visit?

## 2019-10-27 NOTE — Telephone Encounter (Signed)
LMOM for patient to return call.

## 2019-11-01 NOTE — Telephone Encounter (Signed)
3rd attempt to reach patient, [facility] for patient to return call.

## 2019-11-27 ENCOUNTER — Telehealth: Payer: Self-pay | Admitting: Urology

## 2019-11-27 NOTE — Telephone Encounter (Signed)
Would you call Fayrene Helper, NP office and see if he has followed up and had his potassium repeated?

## 2019-11-28 NOTE — Telephone Encounter (Signed)
I called Sean Sosa's office and he did have the potassium lab drawn on 11/25/19 with the result of 4.4

## 2020-01-15 ENCOUNTER — Other Ambulatory Visit: Payer: Self-pay | Admitting: Urology

## 2020-07-20 ENCOUNTER — Emergency Department (HOSPITAL_COMMUNITY)
Admission: EM | Admit: 2020-07-20 | Discharge: 2020-07-20 | Disposition: A | Discharge status: Home / Self Care | Payer: No Typology Code available for payment source | Attending: Emergency Medicine | Admitting: Emergency Medicine

## 2020-07-20 ENCOUNTER — Other Ambulatory Visit: Payer: Self-pay

## 2020-07-20 DIAGNOSIS — I10 Essential (primary) hypertension: Secondary | ICD-10-CM | POA: Diagnosis not present

## 2020-07-20 DIAGNOSIS — Z79899 Other long term (current) drug therapy: Secondary | ICD-10-CM | POA: Insufficient documentation

## 2020-07-20 DIAGNOSIS — F17210 Nicotine dependence, cigarettes, uncomplicated: Secondary | ICD-10-CM | POA: Insufficient documentation

## 2020-07-20 DIAGNOSIS — R21 Rash and other nonspecific skin eruption: Secondary | ICD-10-CM | POA: Insufficient documentation

## 2020-07-20 DIAGNOSIS — Z7982 Long term (current) use of aspirin: Secondary | ICD-10-CM | POA: Diagnosis not present

## 2020-07-20 MED ORDER — TRIAMCINOLONE ACETONIDE 0.1 % EX CREA: 1.0000 "application " | TOPICAL_CREAM | Freq: Two times a day (BID) | CUTANEOUS | 0 refills | Status: DC

## 2020-07-20 NOTE — ED Triage Notes (Signed)
Pt has L lower leg rash, anterior that started 2 mos ago. Rash appears to be 6cm in diameter with scattered redness and yellow discharge.

## 2020-07-20 NOTE — Discharge Instructions (Signed)
Use the steroid cream as directed. Follow-up with your primary care provider. Return to the ER if you start to experience worsening redness, swelling, warmth, fever or increased drainage to the area

## 2020-07-20 NOTE — ED Provider Notes (Signed)
[facility]-EMERGENCY DEPT Provider Note   CSN: [id number] Arrival date & time: 07/20/20  1920     History Chief Complaint  Patient presents with  . Rash    Sean Sosa is a 60 y.o. male complaining of intermittent rash to left shin area for the past 2 months.  States that it will sometimes dry up and become scaly.  He is concerned that it could be due to psoriasis.  No trigger that he is aware of.  It will sometimes improve with antibiotic ointment.  Denies any fevers.  Will intermittently have drainage as well.  Denies any trauma to the area  HPI     Past Medical History:  Diagnosis Date  . History of kidney stones   . Hypercholesteremia   . Nephrolithiasis   . Sleep apnea    Patient reports sleep study was neg.    Patient Active Problem List   Diagnosis Date Noted  . OSA on CPAP 09/11/2015  . Essential (primary) hypertension 02/02/2014  . Current tobacco use 02/02/2014    Past Surgical History:  Procedure Laterality Date  . CYSTOSCOPY W/ RETROGRADES Left 10/12/2018   Procedure: CYSTOSCOPY WITH RETROGRADE PYELOGRAM;  Surgeon: Riki Altes, MD;  Location: [facility];  Service: Urology;  Laterality: Left;  . CYSTOSCOPY/URETEROSCOPY/HOLMIUM LASER/STENT PLACEMENT Left 10/12/2018   Procedure: CYSTOSCOPY/URETEROSCOPY/STENT PLACEMENT;  Surgeon: Riki Altes, MD;  Location: [facility];  Service: Urology;  Laterality: Left;  . CYSTOSCOPY/URETEROSCOPY/HOLMIUM LASER/STENT PLACEMENT Left 11/08/2018   Procedure: CYSTOSCOPY/URETEROSCOPY/HOLMIUM LASER/STENT PLACEMENT;  Surgeon: Riki Altes, MD;  Location: [facility];  Service: Urology;  Laterality: Left;  . EXTRACORPOREAL SHOCK WAVE LITHOTRIPSY Left 10/06/2018   Procedure: EXTRACORPOREAL SHOCK WAVE LITHOTRIPSY (ESWL);  Surgeon: Riki Altes, MD;  Location: [facility];  Service: Urology;  Laterality: Left;  . HAND SURGERY Left 1999   circular saw cut tendons and ligaments  . HERNIA REPAIR  1970's        Family History  Problem Relation Age of Onset  . Prostate cancer Father   . Hematuria Father     Social History   Tobacco Use  . Smoking status: Current Every Day Smoker    Packs/day: 1.50    Types: Cigarettes  . Smokeless tobacco: Never Used  Vaping Use  . Vaping Use: Never used  Substance Use Topics  . Alcohol use: No  . Drug use: Never    Home Medications Prior to Admission medications   Medication Sig Start Date End Date Taking? Authorizing Provider  aspirin EC 81 MG tablet Take 81 mg by mouth every evening.  11/24/13   [provider]  furosemide (LASIX) 20 MG tablet Take 1 tablet (20 mg total) by mouth daily. 10/01/19 10/31/19  Michiel Cowboy A, PA-C  ondansetron (ZOFRAN ODT) 4 MG disintegrating tablet Take 1 tablet (4 mg total) by mouth every 8 (eight) hours as needed for nausea or vomiting. 10/07/18   Stoioff, Verna Czech, MD  oxybutynin (DITROPAN) 5 MG tablet 1 tab tid prn frequency,urgency, bladder spasm 11/08/18   Stoioff, Verna Czech, MD  oxyCODONE-acetaminophen (PERCOCET) 5-325 MG tablet Take 1 tablet by mouth every 4 (four) hours as needed for severe pain. 11/08/18   Stoioff, Verna Czech, MD  sildenafil (VIAGRA) 100 MG tablet Take 1 tablet (100 mg total) by mouth daily as needed. 08/08/19   Michiel Cowboy A, PA-C  tamsulosin (FLOMAX) 0.4 MG CAPS capsule Take 1 capsule (0.4 mg total) by mouth daily. 08/08/19   Michiel Cowboy  A, PA-C  triamcinolone cream (KENALOG) 0.1 % Apply 1 application topically 2 (two) times daily. 07/20/20  Yes Hero Mccathern, PA-C    Allergies    Patient has no known allergies.  Review of Systems   Review of Systems  Constitutional: Negative for chills and fever.  Skin: Positive for rash.  Neurological: Negative for weakness and numbness.    Physical Exam Updated Vital Signs BP 122/78 (BP Location: Left Arm)   Pulse 88   Temp 97.6 F (36.4 C) (Oral)   Resp 18   Ht 6\' 1"  (1.854 m)   Wt 133.8 kg   SpO2 96%   BMI 38.92 kg/m    Physical Exam Vitals and nursing note reviewed.  Constitutional:      General: He is not in acute distress.    Appearance: He is well-developed. He is not diaphoretic.  HENT:     Head: Normocephalic and atraumatic.  Eyes:     General: No scleral icterus.    Conjunctiva/sclera: Conjunctivae normal.  Pulmonary:     Effort: Pulmonary effort is normal. No respiratory distress.  Musculoskeletal:     Cervical back: Normal range of motion.  Skin:    Findings: Rash present.     Comments: Erythematous, slightly scaly rash noted to left shin.  There is 1 area that has some serous fluid.  Is not warm to touch.  Neurological:     Mental Status: He is alert.     ED Results / Procedures / Treatments   Labs (all labs ordered are listed, but only abnormal results are displayed) Labs Reviewed - No data to display  EKG None  Radiology No results found.  Procedures Procedures   Medications Ordered in ED Medications - No data to display  ED Course  I have reviewed the triage vital signs and the nursing notes.  Pertinent labs & imaging results that were available during my care of the patient were reviewed by me and considered in my medical decision making (see chart for details).    MDM Rules/Calculators/A&P                          Pt has a patent airway without stridor and is handling secretions without difficulty; no angioedema. No blisters, no pustules, no warmth, no draining sinus tracts, no superficial abscesses, no bullous impetigo, no vesicles, no desquamation, no target lesions with dusky purpura or a central bulla. Not tender to touch. No concern for superimposed infection. No concern for SJS, TEN, TSS, tick borne illness, syphilis or other life-threatening condition.  Will treat with topical steroid cream.  We will have him follow-up with PCP.  Return precautions given.   Patient is hemodynamically stable, in NAD, and able to ambulate in the ED. Evaluation does not show  pathology that would require ongoing emergent intervention or inpatient treatment. I explained the diagnosis to the patient. Pain has been managed and has no complaints prior to discharge. Patient is comfortable with above plan and is stable for discharge at this time. All questions were answered prior to disposition. Strict return precautions for returning to the ED were discussed. Encouraged follow up with PCP.   An After Visit Summary was printed and given to the patient.   Portions of this note were generated with . Dictation errors may occur despite best attempts at proofreading.  Final Clinical Impression(s) / ED Diagnoses Final diagnoses:  Rash and nonspecific skin eruption  Rx / DC Orders ED Discharge Orders         Ordered    triamcinolone cream (KENALOG) 0.1 %  2 times daily        07/20/20 1949           Dietrich Pates, Cordelia Poche 07/20/20 1951    Lorre Nick, MD 07/21/20 1627

## 2020-09-07 IMAGING — CR ABDOMEN - 1 VIEW
2 series · 2 of 2 positions shown · non-contrast
Comparison: CT scan of December 12, 2014.

CLINICAL DATA: Nephrolithiasis.

EXAM:
ABDOMEN - 1 VIEW

[abdomen kub (1 of 2)]
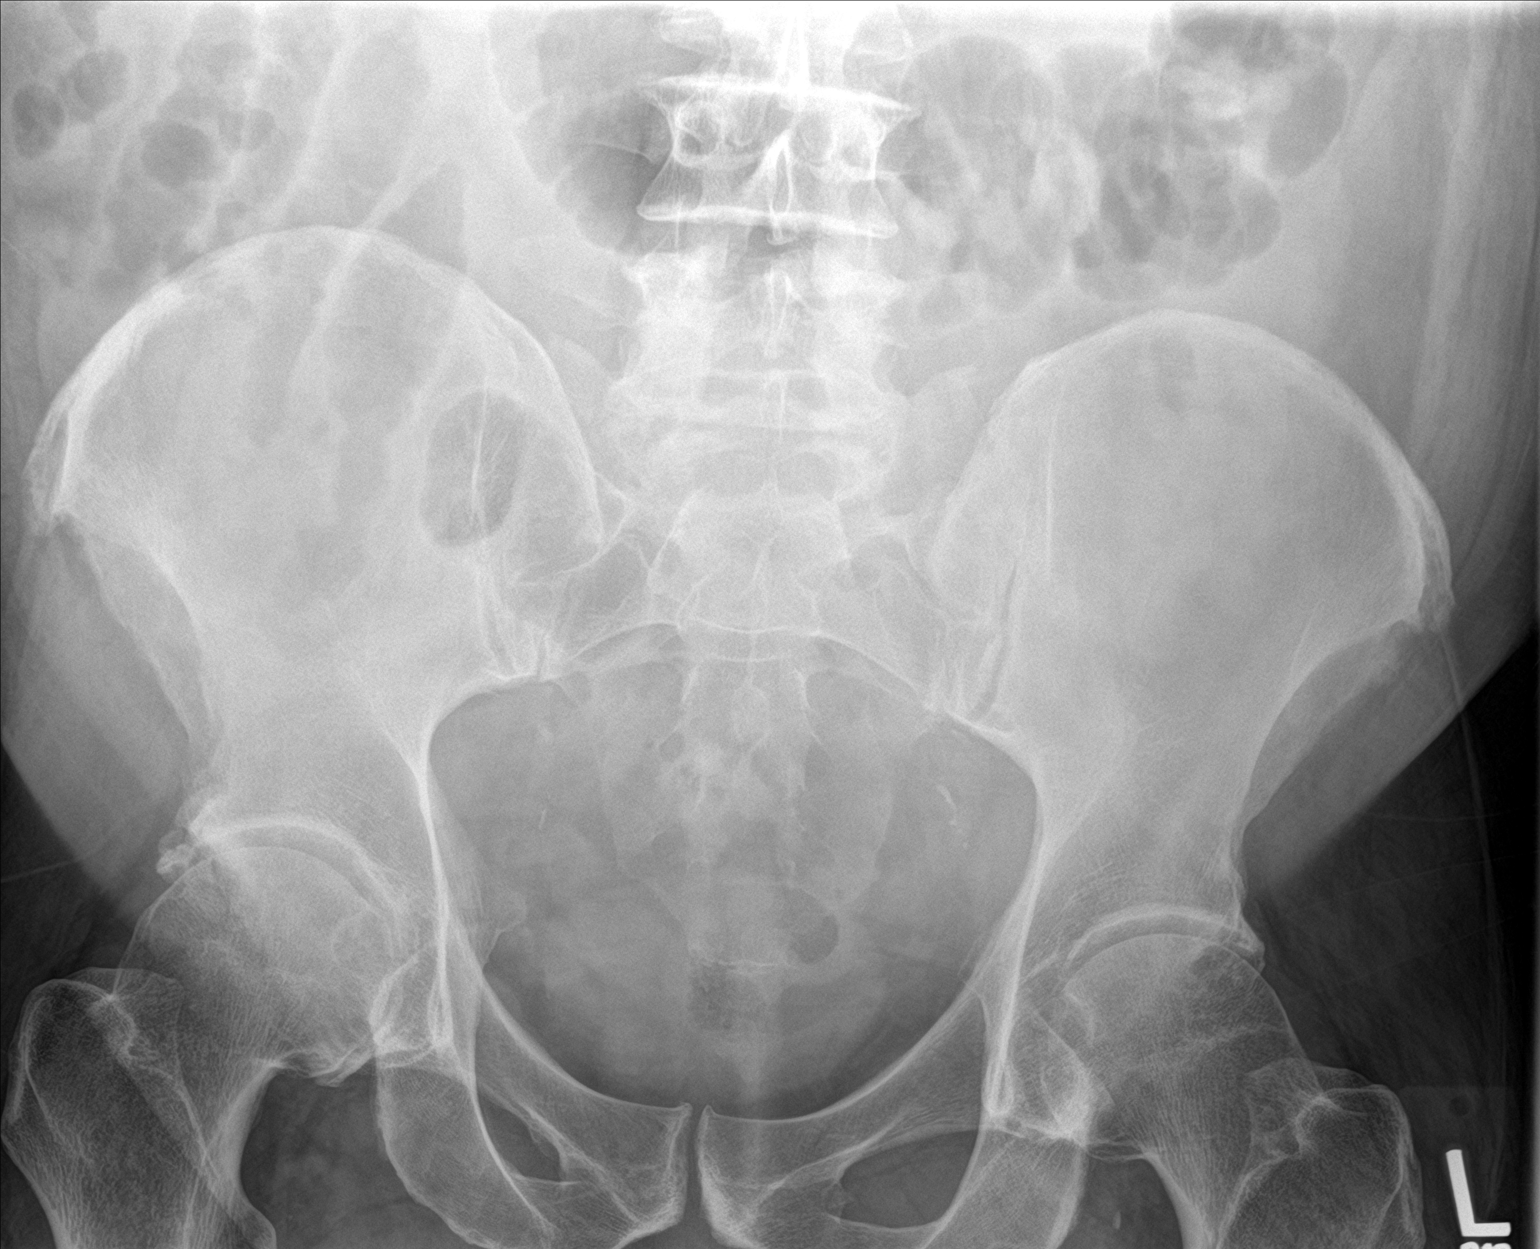

[abdomen kub (2 of 2)]
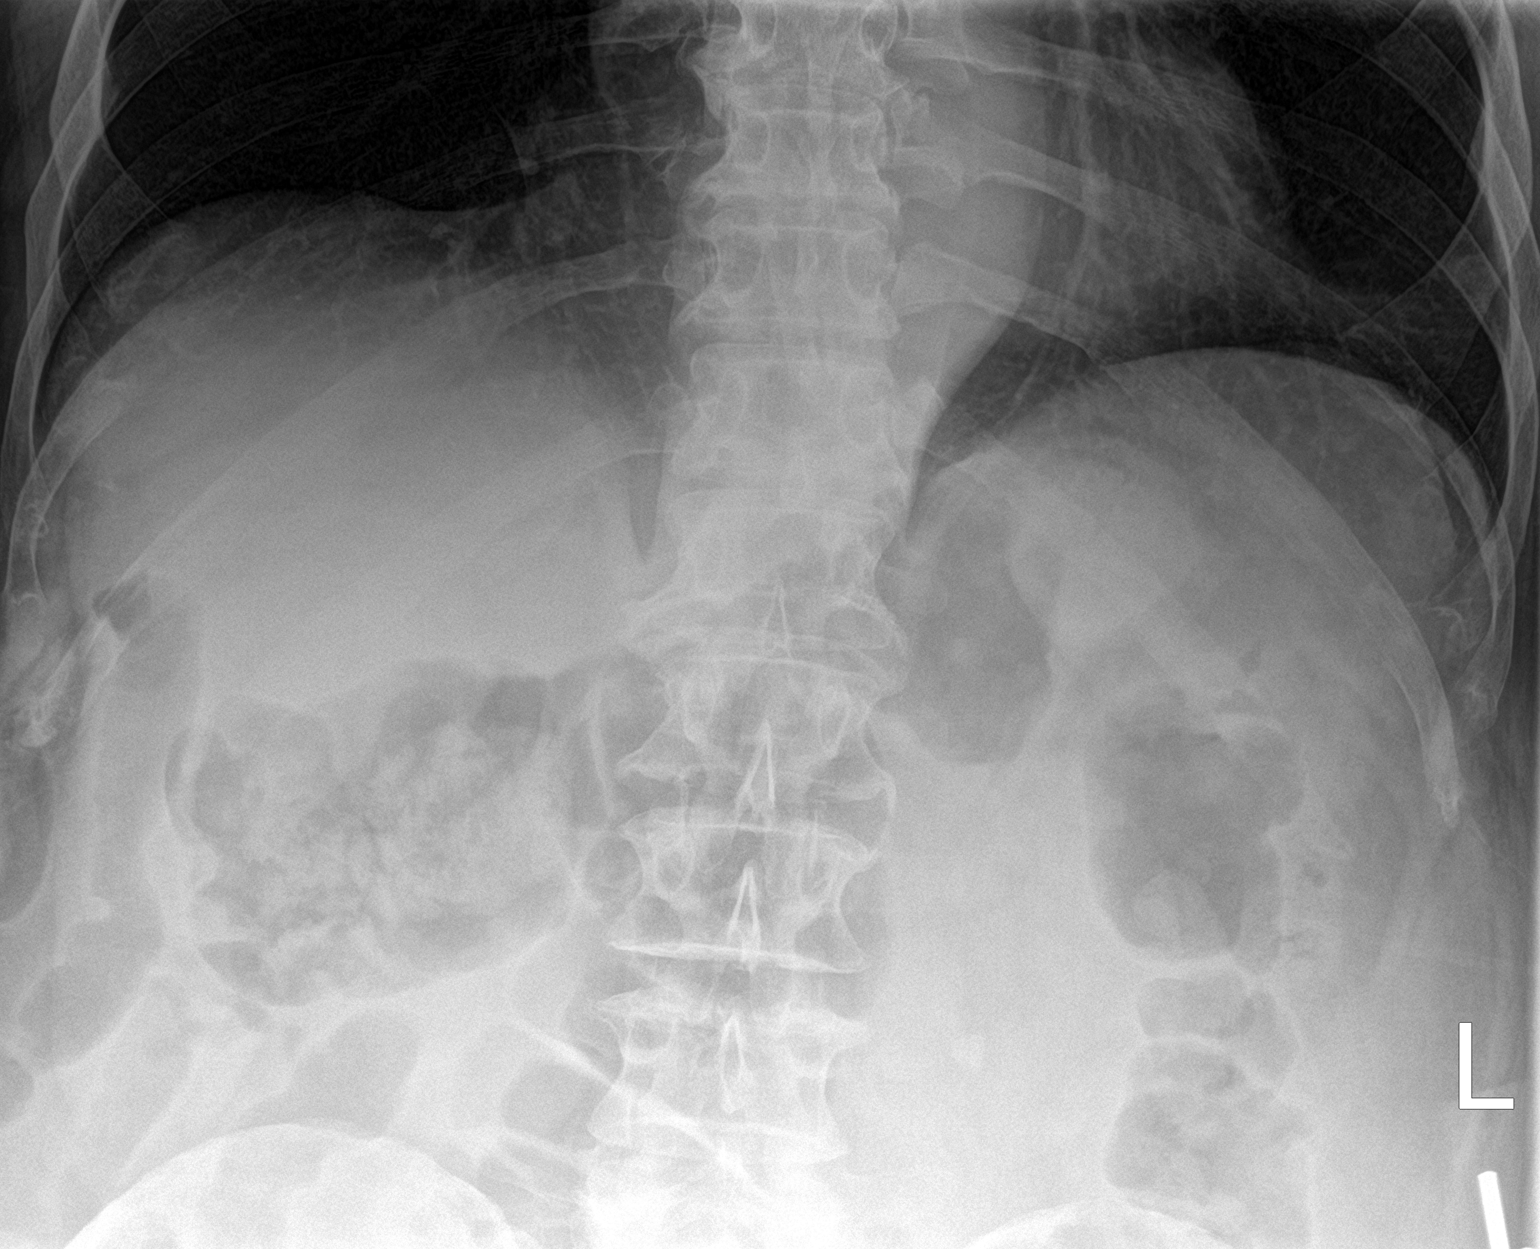

[2 of 2 positions shown; findings below may reference images not displayed]

FINDINGS: The bowel gas pattern is normal. 1 cm calculus is seen in the region
of the lower pole of left kidney.
IMPRESSION: Probable large left renal calculus. No evidence of bowel obstruction
or ileus.

## 2020-10-14 NOTE — Progress Notes (Deleted)
10/15/2020 12:46 PM   Sean Sosa 1960-07-13 [id number]  Referring provider: Fayrene Helper, NP [address] [place],  [place] [address]  Urological history: 1. Nephrolithiasis -Stone composition 100% calcium oxalate monohydrate -Litholink x 2 inconclusive secondary to improper collection technique -most recent occurrence underwent left URS with Dr. Lonna Cobb on 11/08/2018 -KUB ***  2. BPH with LU TS -PSA pending -cysto during URS 10/2018 - prostate demonstrated moderate lateral lobe enlargement -I PSS *** -managed with tamsulosin 0.4 mg daily  3. ED -contributing factors of age, smoking, HLD, HTN, BPH and sleep apnea -SHIM *** -managed with sildenafil 100 mg, on-demand-dosing   No chief complaint on file.   HPI: Sean Sosa is a 60 y.o. male who presents today for yearly follow up.    Score:  1-7 Mild 8-19 Moderate 20-35 Severe     Score: 1-7 Severe ED 8-11 Moderate ED 12-16 Mild-Moderate ED 17-21 Mild ED 22-25 No ED   PMH: Past Medical History:  Diagnosis Date   History of kidney stones    Hypercholesteremia    Nephrolithiasis    Sleep apnea    Patient reports sleep study was neg.    Surgical History: Past Surgical History:  Procedure Laterality Date   CYSTOSCOPY W/ RETROGRADES Left 10/12/2018   Procedure: CYSTOSCOPY WITH RETROGRADE PYELOGRAM;  Surgeon: Riki Altes, MD;  Location: [facility];  Service: Urology;  Laterality: Left;   CYSTOSCOPY/URETEROSCOPY/HOLMIUM LASER/STENT PLACEMENT Left 10/12/2018   Procedure: CYSTOSCOPY/URETEROSCOPY/STENT PLACEMENT;  Surgeon: Riki Altes, MD;  Location: [facility];  Service: Urology;  Laterality: Left;   CYSTOSCOPY/URETEROSCOPY/HOLMIUM LASER/STENT PLACEMENT Left 11/08/2018   Procedure: CYSTOSCOPY/URETEROSCOPY/HOLMIUM LASER/STENT PLACEMENT;  Surgeon: Riki Altes, MD;  Location: [facility];  Service: Urology;  Laterality: Left;   EXTRACORPOREAL SHOCK WAVE LITHOTRIPSY Left 10/06/2018    Procedure: EXTRACORPOREAL SHOCK WAVE LITHOTRIPSY (ESWL);  Surgeon: Riki Altes, MD;  Location: [facility];  Service: Urology;  Laterality: Left;   HAND SURGERY Left 1999   circular saw cut tendons and ligaments   HERNIA REPAIR  1970's    Home Medications:  Allergies as of 10/15/2020   No Known Allergies      Medication List        Accurate as of October 14, 2020 12:46 PM. If you have any questions, ask your nurse or doctor.          aspirin EC 81 MG tablet Take 81 mg by mouth every evening.   furosemide 20 MG tablet Commonly known as: Lasix Take 1 tablet (20 mg total) by mouth daily.   ondansetron 4 MG disintegrating tablet Commonly known as: Zofran ODT Take 1 tablet (4 mg total) by mouth every 8 (eight) hours as needed for nausea or vomiting.   oxybutynin 5 MG tablet Commonly known as: DITROPAN 1 tab tid prn frequency,urgency, bladder spasm   oxyCODONE-acetaminophen 5-325 MG tablet Commonly known as: Percocet Take 1 tablet by mouth every 4 (four) hours as needed for severe pain.   sildenafil 100 MG tablet Commonly known as: VIAGRA Take 1 tablet (100 mg total) by mouth daily as needed.   tamsulosin 0.4 MG Caps capsule Commonly known as: FLOMAX Take 1 capsule (0.4 mg total) by mouth daily.   triamcinolone cream 0.1 % Commonly known as: KENALOG Apply 1 application topically 2 (two) times daily.        Allergies: No Known Allergies  Family History: Family History  Problem Relation Age of Onset   Prostate cancer Father  Hematuria Father     Social History:  reports that he has been smoking cigarettes. He has been smoking an average of 1.5 packs per day. He has never used smokeless tobacco. He reports that he does not drink alcohol and does not use drugs.  ROS: Pertinent ROS in HPI  Physical Exam: There were no vitals taken for this visit.  Constitutional:  Well nourished. Alert and oriented, No acute distress. HEENT: [place] AT, moist mucus  membranes.  Trachea midline, no masses. Cardiovascular: No clubbing, cyanosis, or edema. Respiratory: Normal respiratory effort, no increased work of breathing. GI: Abdomen is soft, non tender, non distended, no abdominal masses. Liver and spleen not palpable.  No hernias appreciated.  Stool sample for occult testing is not indicated.   GU: No CVA tenderness.  No bladder fullness or masses.  Patient with circumcised/uncircumcised phallus. ***Foreskin easily retracted***  Urethral meatus is patent.  No penile discharge. No penile lesions or rashes. Scrotum without lesions, cysts, rashes and/or edema.  Testicles are located scrotally bilaterally. No masses are appreciated in the testicles. Left and right epididymis are normal. Rectal: Patient with  normal sphincter tone. Anus and perineum without scarring or rashes. No rectal masses are appreciated. Prostate is approximately *** grams, *** nodules are appreciated. Seminal vesicles are normal. Skin: No rashes, bruises or suspicious lesions. Lymph: No cervical or inguinal adenopathy. Neurologic: Grossly intact, no focal deficits, moving all 4 extremities. Psychiatric: Normal mood and affect.  Laboratory Data: PSA pending Urinalysis *** I have reviewed the labs.     Pertinent Imaging: *** I have independently reviewed the films.    Assessment & Plan:  ***  1. Nephrolithiasis -KUB ***  2. BPH with LU TS - PSA pending - DRE *** -continue tamsulosin 0.4 mg daily  3. ED -continue sildenafil 100 mg, on-demand-dosing  No follow-ups on file.  These notes generated with voice recognition software. I apologize for typographical errors.  Michiel Cowboy, PA-C  [facility] [address]  [address] [place], [place] [address] [phone]

## 2020-10-15 ENCOUNTER — Ambulatory Visit: Payer: No Typology Code available for payment source | Admitting: Urology

## 2020-10-15 ENCOUNTER — Other Ambulatory Visit: Payer: Self-pay | Admitting: Family Medicine

## 2020-10-15 DIAGNOSIS — N138 Other obstructive and reflux uropathy: Secondary | ICD-10-CM

## 2020-10-15 DIAGNOSIS — N200 Calculus of kidney: Secondary | ICD-10-CM

## 2020-10-15 DIAGNOSIS — N529 Male erectile dysfunction, unspecified: Secondary | ICD-10-CM

## 2020-10-18 ENCOUNTER — Encounter: Payer: Self-pay | Admitting: Urology

## 2020-10-30 ENCOUNTER — Emergency Department (HOSPITAL_COMMUNITY)
Admission: EM | Admit: 2020-10-30 | Discharge: 2020-10-30 | Disposition: A | Discharge status: Home / Self Care | Payer: PRIVATE HEALTH INSURANCE | Attending: Emergency Medicine | Admitting: Emergency Medicine

## 2020-10-30 ENCOUNTER — Encounter (HOSPITAL_COMMUNITY): Payer: Self-pay | Admitting: Emergency Medicine

## 2020-10-30 DIAGNOSIS — I10 Essential (primary) hypertension: Secondary | ICD-10-CM | POA: Insufficient documentation

## 2020-10-30 DIAGNOSIS — M545 Low back pain, unspecified: Secondary | ICD-10-CM | POA: Insufficient documentation

## 2020-10-30 DIAGNOSIS — F17210 Nicotine dependence, cigarettes, uncomplicated: Secondary | ICD-10-CM | POA: Diagnosis not present

## 2020-10-30 DIAGNOSIS — Z79899 Other long term (current) drug therapy: Secondary | ICD-10-CM | POA: Insufficient documentation

## 2020-10-30 DIAGNOSIS — Z7982 Long term (current) use of aspirin: Secondary | ICD-10-CM | POA: Insufficient documentation

## 2020-10-30 MED ORDER — KETOROLAC TROMETHAMINE 15 MG/ML IJ SOLN: 15.0000 mg | Freq: Once | INTRAMUSCULAR | Status: DC

## 2020-10-30 MED ORDER — KETOROLAC TROMETHAMINE 15 MG/ML IJ SOLN
15.0000 mg | Freq: Once | INTRAMUSCULAR | Status: AC
  Administered 2020-10-30: 15 mg via INTRAMUSCULAR
  Filled 2020-10-30: qty 1

## 2020-10-30 MED ORDER — ACETAMINOPHEN 325 MG PO TABS
650.0000 mg | ORAL_TABLET | Freq: Once | ORAL | Status: AC
  Administered 2020-10-30: 650 mg via ORAL
  Filled 2020-10-30: qty 2

## 2020-10-30 MED ORDER — METHOCARBAMOL 500 MG PO TABS: 500.0000 mg | ORAL_TABLET | Freq: Two times a day (BID) | ORAL | 0 refills | Status: DC

## 2020-10-30 NOTE — ED Notes (Signed)
An After Visit Summary was printed and given to the patient. Discharge instructions given and no further questions at this time.  

## 2020-10-30 NOTE — ED Provider Notes (Signed)
Does not [facility]-EMERGENCY DEPT Provider Note   CSN: [id number] Arrival date & time: 10/30/20  1108     History Chief Complaint  Patient presents with   Back Pain    Sean Sosa is a 60 y.o. male with a past medical history significant for lower back pain who presents with acute on chronic back pain worsening after bending over and tweaking his back 4 days ago.  Patient reports the pain is not radiating, is isolated to his lower back, is worse with sitting and relieved with standing and leaning forward.  Patient denies any numbness or tingling in his lower extremities, denies saddle anesthesia, denies difficulty defecating, or urinating.  Patient has been taking ibuprofen, and applying a gel to his back with minimal relief.   Back Pain     Past Medical History:  Diagnosis Date   History of kidney stones    Hypercholesteremia    Nephrolithiasis    Sleep apnea    Patient reports sleep study was neg.    Patient Active Problem List   Diagnosis Date Noted   OSA on CPAP 09/11/2015   Essential (primary) hypertension 02/02/2014   Current tobacco use 02/02/2014    Past Surgical History:  Procedure Laterality Date   CYSTOSCOPY W/ RETROGRADES Left 10/12/2018   Procedure: CYSTOSCOPY WITH RETROGRADE PYELOGRAM;  Surgeon: Riki Altes, MD;  Location: [facility] ORS;  Service: Urology;  Laterality: Left;   CYSTOSCOPY/URETEROSCOPY/HOLMIUM LASER/STENT PLACEMENT Left 10/12/2018   Procedure: CYSTOSCOPY/URETEROSCOPY/STENT PLACEMENT;  Surgeon: Riki Altes, MD;  Location: [facility] ORS;  Service: Urology;  Laterality: Left;   CYSTOSCOPY/URETEROSCOPY/HOLMIUM LASER/STENT PLACEMENT Left 11/08/2018   Procedure: CYSTOSCOPY/URETEROSCOPY/HOLMIUM LASER/STENT PLACEMENT;  Surgeon: Riki Altes, MD;  Location: [facility] ORS;  Service: Urology;  Laterality: Left;   EXTRACORPOREAL SHOCK WAVE LITHOTRIPSY Left 10/06/2018   Procedure: EXTRACORPOREAL SHOCK WAVE LITHOTRIPSY (ESWL);   Surgeon: Riki Altes, MD;  Location: [facility] ORS;  Service: Urology;  Laterality: Left;   HAND SURGERY Left 1999   circular saw cut tendons and ligaments   HERNIA REPAIR  1970's       Family History  Problem Relation Age of Onset   Prostate cancer Father    Hematuria Father     Social History   Tobacco Use   Smoking status: Every Day    Packs/day: 1.50    Types: Cigarettes   Smokeless tobacco: Never  Vaping Use   Vaping Use: Never used  Substance Use Topics   Alcohol use: No   Drug use: Never    Home Medications Prior to Admission medications   Medication Sig Start Date End Date Taking? Authorizing Provider  methocarbamol (ROBAXIN) 500 MG tablet Take 1 tablet (500 mg total) by mouth 2 (two) times daily. 10/30/20  Yes Candace Ramus H, PA-C  aspirin EC 81 MG tablet Take 81 mg by mouth every evening.  11/24/13   [provider]  furosemide (LASIX) 20 MG tablet Take 1 tablet (20 mg total) by mouth daily. 10/01/19 10/31/19  Michiel Cowboy A, PA-C  ondansetron (ZOFRAN ODT) 4 MG disintegrating tablet Take 1 tablet (4 mg total) by mouth every 8 (eight) hours as needed for nausea or vomiting. 10/07/18   Stoioff, Verna Czech, MD  oxybutynin (DITROPAN) 5 MG tablet 1 tab tid prn frequency,urgency, bladder spasm 11/08/18   Stoioff, Verna Czech, MD  oxyCODONE-acetaminophen (PERCOCET) 5-325 MG tablet Take 1 tablet by mouth every 4 (four) hours as needed for severe pain. 11/08/18   Stoioff, Lorin Picket  C, MD  sildenafil (VIAGRA) 100 MG tablet Take 1 tablet (100 mg total) by mouth daily as needed. 08/08/19   Michiel Cowboy A, PA-C  tamsulosin (FLOMAX) 0.4 MG CAPS capsule Take 1 capsule (0.4 mg total) by mouth daily. 08/08/19   Michiel Cowboy A, PA-C  triamcinolone cream (KENALOG) 0.1 % Apply 1 application topically 2 (two) times daily. 07/20/20   Dietrich Pates, PA-C    Allergies    Patient has no known allergies.  Review of Systems   Review of Systems  Musculoskeletal:  Positive for back  pain.  All other systems reviewed and are negative.  Physical Exam Updated Vital Signs BP 132/84 (BP Location: Right Arm)   Pulse 82   Temp 98.9 F (37.2 C) (Oral)   Resp 18   SpO2 96%   Physical Exam Vitals and nursing note reviewed.  Constitutional:      General: He is not in acute distress.    Appearance: Normal appearance.  HENT:     Head: Normocephalic and atraumatic.  Eyes:     General:        Right eye: No discharge.        Left eye: No discharge.  Cardiovascular:     Rate and Rhythm: Normal rate and regular rhythm.     Comments: DP, PT pulses intact bilaterally Pulmonary:     Effort: Pulmonary effort is normal. No respiratory distress.  Musculoskeletal:        General: No deformity.     Comments: Tenderness to palpation over paraspinous muscles of the lower lumbar, sacral spine.  Negative straight leg raise bilaterally.  5 out of 5 strength at hip and with plantar and dorsiflexion.  No step-offs, no midline spinal tenderness.  Patient is able to walk without difficulty, with some pain.  Skin:    General: Skin is warm and dry.     Capillary Refill: Capillary refill takes less than 2 seconds. In both feet bilaterally Neurological:     Mental Status: He is alert and oriented to person, place, and time.     Comments: Patient does not endorse any sensory changes including numbness or tingling in his lower extremities.  Psychiatric:        Mood and Affect: Mood normal.        Behavior: Behavior normal.    ED Results / Procedures / Treatments   Labs (all labs ordered are listed, but only abnormal results are displayed) Labs Reviewed - No data to display  EKG None  Radiology No results found.  Procedures Procedures   Medications Ordered in ED Medications  ketorolac (TORADOL) 15 MG/ML injection 15 mg (has no administration in time range)  acetaminophen (TYLENOL) tablet 650 mg (has no administration in time range)    ED Course  I have reviewed the triage  vital signs and the nursing notes.  Pertinent labs & imaging results that were available during my care of the patient were reviewed by me and considered in my medical decision making (see chart for details).    MDM Rules/Calculators/A&P                         Patient presentation is consistent with uncomplicated musculoskeletal back pain.  Patient has no red flags for back pain including intravenous drug use, chronic corticosteroid use, history of cancer, current fever.  Patient with no saddle anesthesia no difficulty urinating, or defecating.  Patient with no numbness, tingling, or strength deficits of  either of his lower extremities.  Neurovascularly intact.  Discussed pain control management to include alternating ibuprofen and Tylenol, with addition of muscle relaxant.  We will administer Toradol 15 mg IM, and Tylenol prior to discharge.  Recommend follow-up with orthopedics if pain does not improve despite treatment.  Return precautions given.  Patient discharged in stable condition. Final Clinical Impression(s) / ED Diagnoses Final diagnoses:  Acute left-sided low back pain without sciatica    Rx / DC Orders ED Discharge Orders          Ordered    methocarbamol (ROBAXIN) 500 MG tablet  2 times daily        10/30/20 1242             Virda Betters, [place], PA-C 10/30/20 1243    Wynetta Fines, MD 11/01/20 [phone]

## 2020-10-30 NOTE — Discharge Instructions (Addendum)
Please use Tylenol or ibuprofen for pain.  You may use 600 mg ibuprofen every 6 hours or 1000 mg of Tylenol every 6 hours.  You may choose to alternate between the 2.  This would be most effective.  Not to exceed 4 g of Tylenol within 24 hours.  Not to exceed 3200 mg ibuprofen 24 hours.  You may take the muscle relaxant in addition to the above regimen. Heating pads, stretching, topical pain relief in addition to the above should help to improve your pain.  Please follow up with orthopedics if your pain worsens or fails to improve.

## 2020-10-30 NOTE — ED Triage Notes (Signed)
Per pt, states he bent down to pick something up about 4 days ago and think he tweaked his back-lower back pain without radiculopathy, no incontinence

## 2020-11-10 IMAGING — CT CT RENAL STONE PROTOCOL
2 of 4 series · 17 of 46 positions shown, 19 images · non-contrast
Comparison: Nine days ago

CLINICAL DATA: Urinary tract stone

EXAM:
CT ABDOMEN AND PELVIS WITHOUT CONTRAST
TECHNIQUE: Multidetector CT imaging of the abdomen and pelvis was performed
following the standard protocol without IV contrast.

[Series 2: stone full standard · axial · 0.88mm/px · z∈[-1064,-584]mm · 14 of 106 slices shown, 16 images]
[im 5/106  soft-tissue]
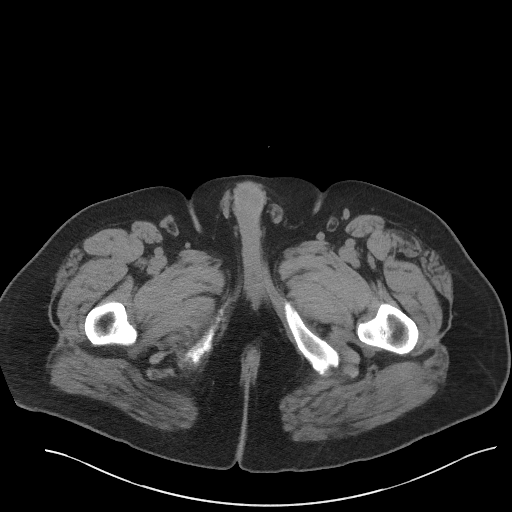
[im 5/106  bone]
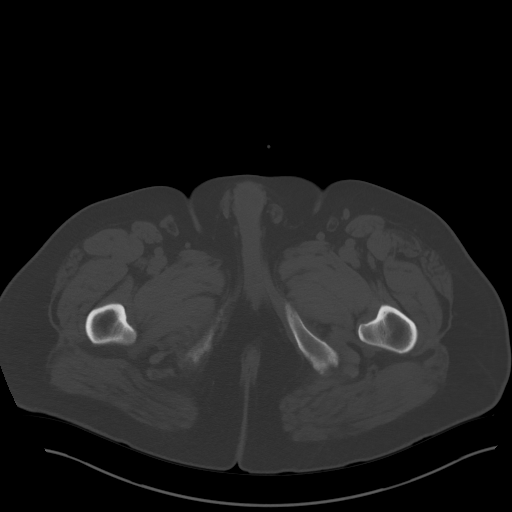
[im 14/106  soft-tissue]
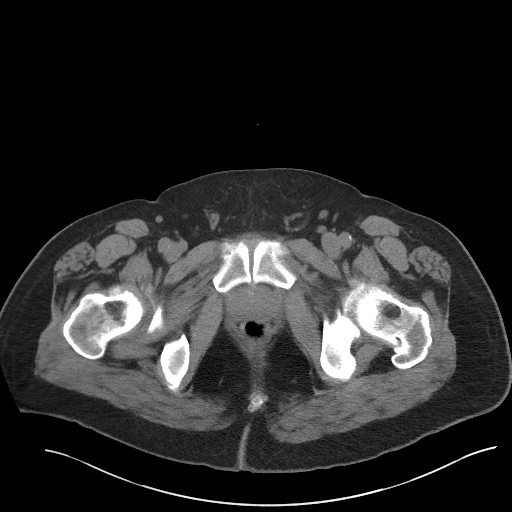
[im 22/106  soft-tissue]
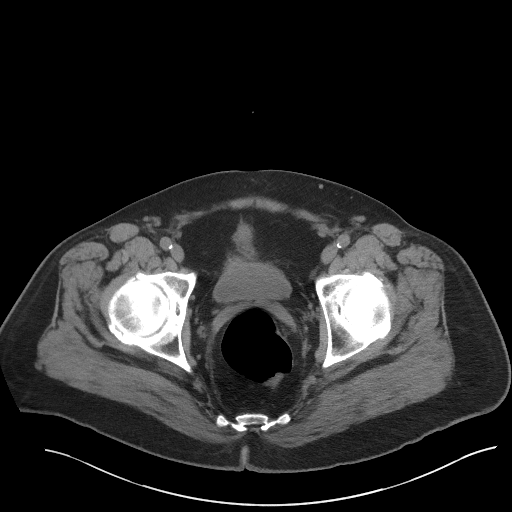
[im 27/106  soft-tissue]
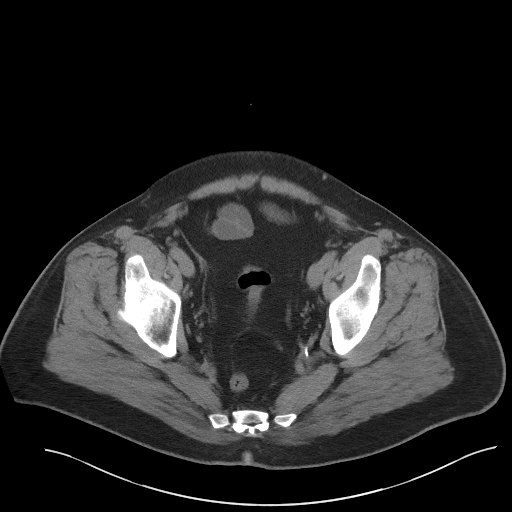
[im 36/106  soft-tissue]
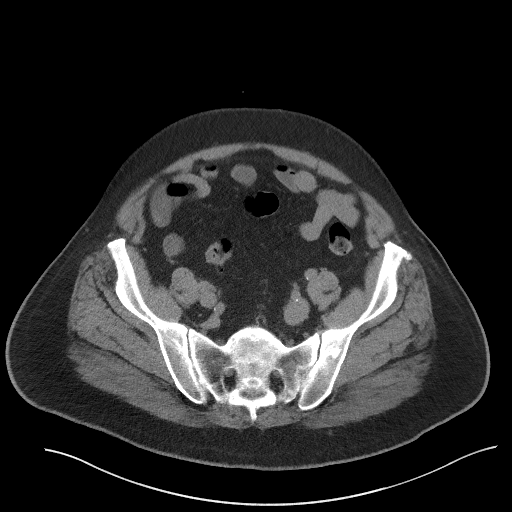
[im 44/106  soft-tissue]
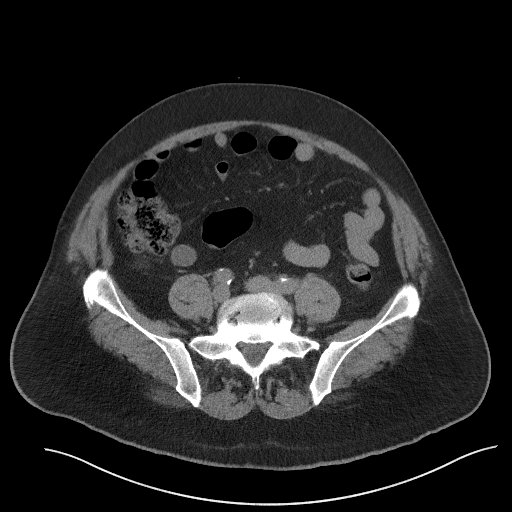
[im 49/106  soft-tissue]
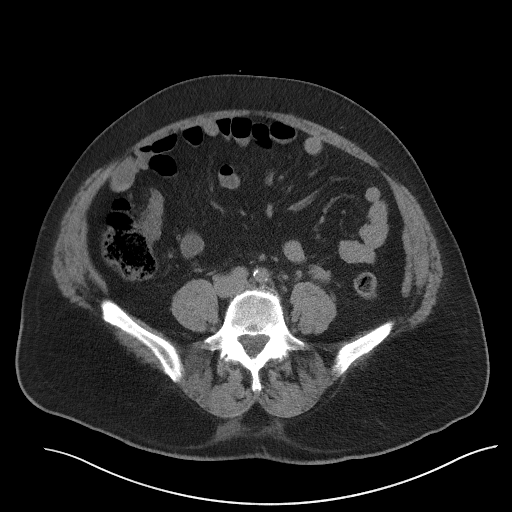
[im 57/106  soft-tissue]
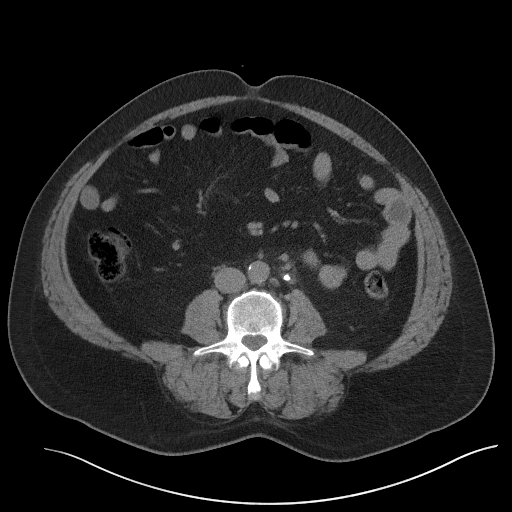
[im 62/106  soft-tissue]
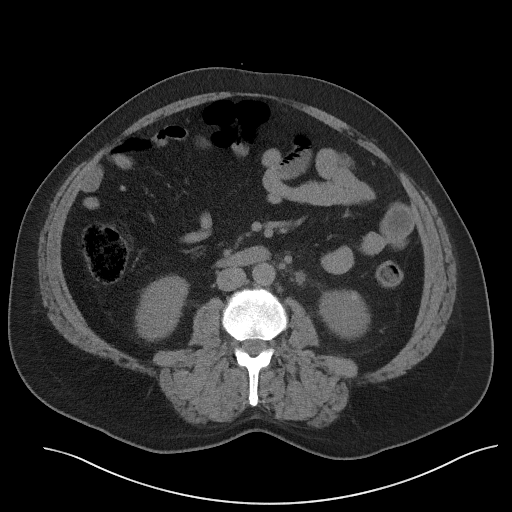
[im 62/106  bone]
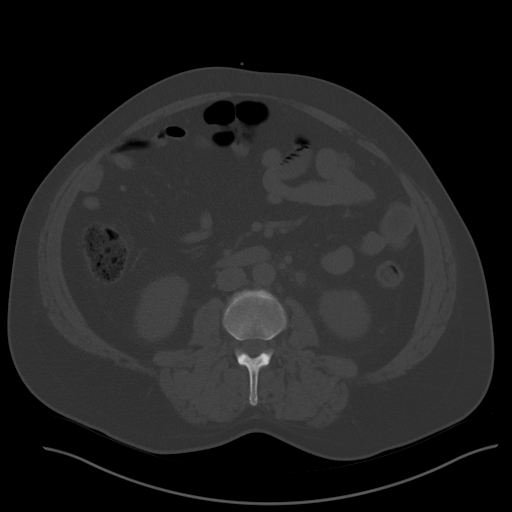
[im 71/106  soft-tissue]
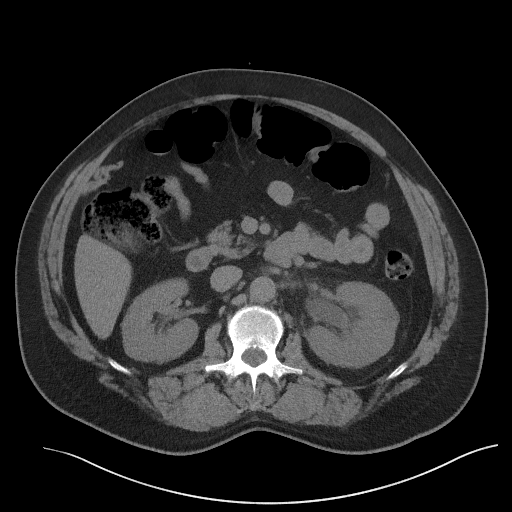
[im 79/106  soft-tissue]
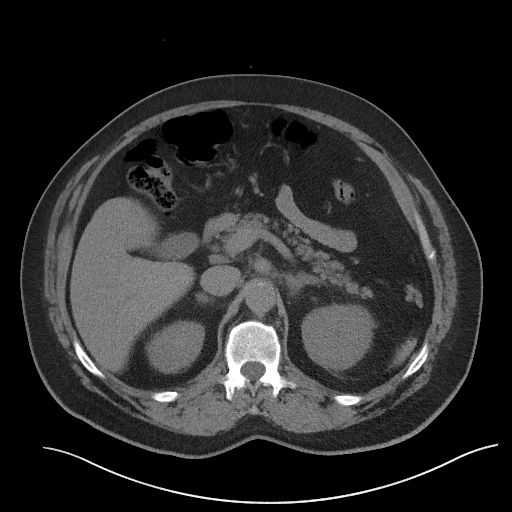
[im 84/106  soft-tissue]
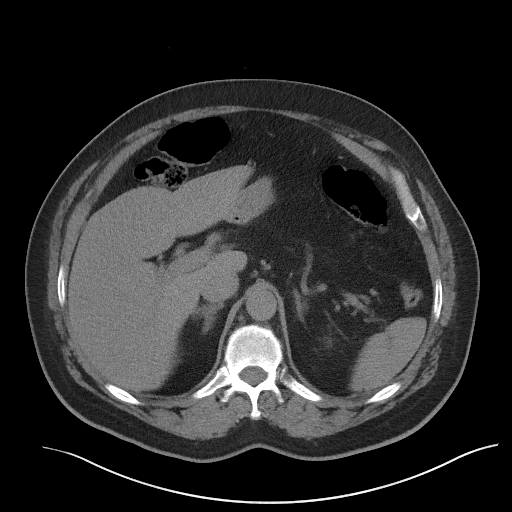
[im 92/106  soft-tissue]
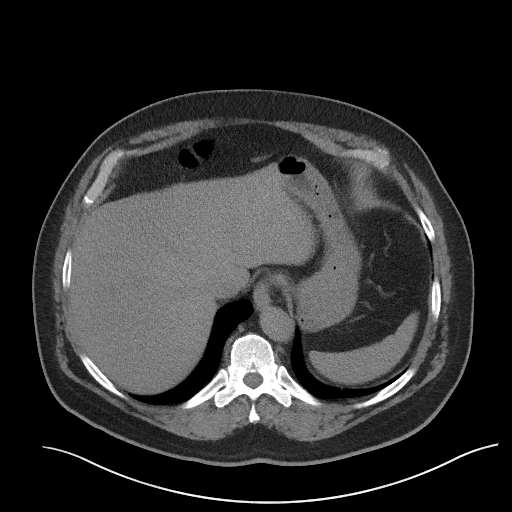
[im 101/106  soft-tissue]
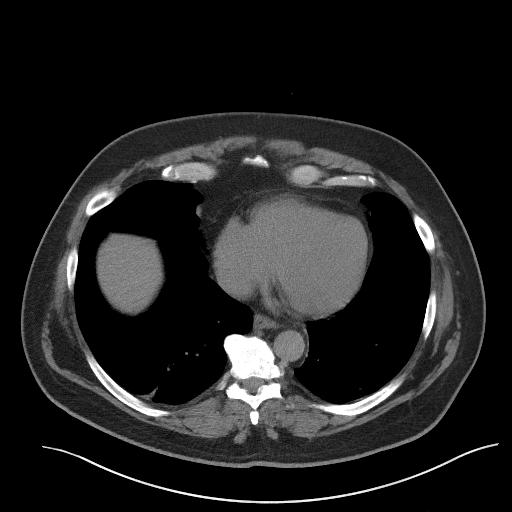

[Series 5: coronal · coronal · 0.90mm/px · 3 of 174 slices shown]
[im 58/174  soft-tissue]
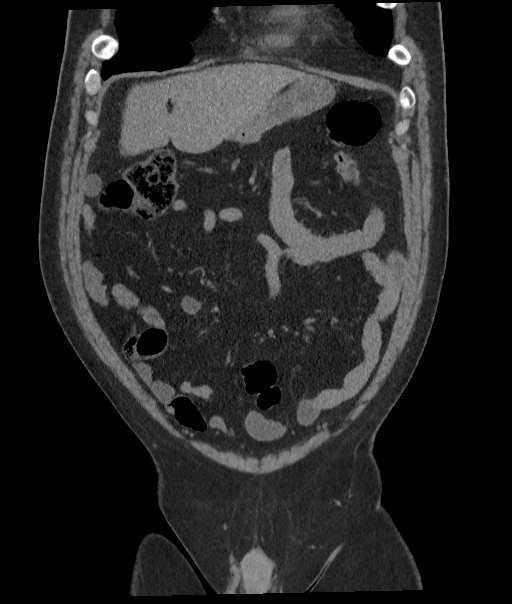
[im 77/174  soft-tissue]
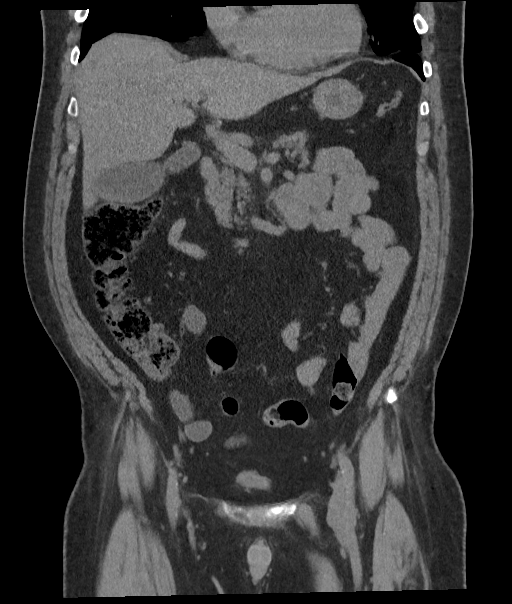
[im 97/174  soft-tissue]
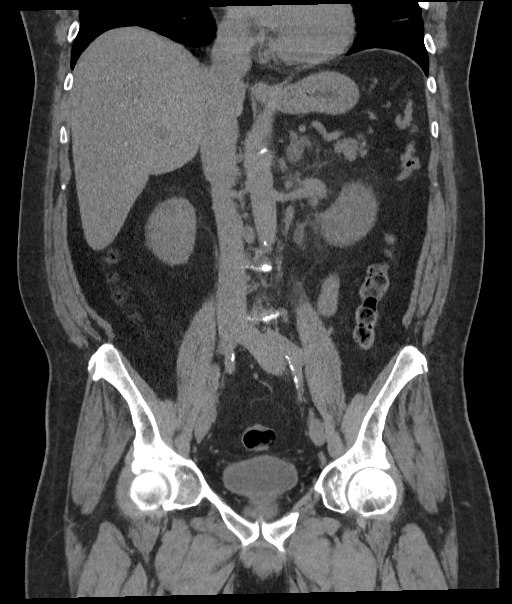

[17 of 46 positions shown; findings below may reference images not displayed]

FINDINGS: Lower chest:  Mild atelectasis in the lower lungs.

Hepatobiliary: No focal liver abnormality.High-density layering in
the gallbladder. No gallbladder inflammatory changes.

Pancreas: Unremarkable.

Spleen: Unremarkable.

Adrenals/Urinary Tract: Negative adrenals. 8 x 6 mm stone in the
upper left ureter with left hydronephrosis, perinephric stranding,
and expansive renal low-density. At least 4 left renal calculi
measuring up to 6 mm. Subcentimeter high-density nodule in the lower
pole left kidney, apparently new, likely hemorrhagic cyst after
interval lithotripsy. Negative bladder.

Stomach/Bowel:  No obstruction. Mild sigmoid diverticulosis.

Vascular/Lymphatic: No acute vascular abnormality. No mass or
adenopathy.

Reproductive:No pathologic findings.

Other: No ascites or pneumoperitoneum.

Musculoskeletal: No acute abnormalities.
IMPRESSION: 1. Obstructing 8 x 6 mm stone in the upper left ureter.
2. Interval lithotripsy now with multiple left renal calculi.
3. Sludge or noncalcified calculi at the gallbladder.

## 2020-11-18 IMAGING — CR DG ABDOMEN 1V
2 series · 2 of 2 positions shown · non-contrast
Comparison: 10/11/2018

CLINICAL DATA: History of recent lithotripsy

EXAM:
ABDOMEN - 1 VIEW

[abdomen kub (1 of 2)]
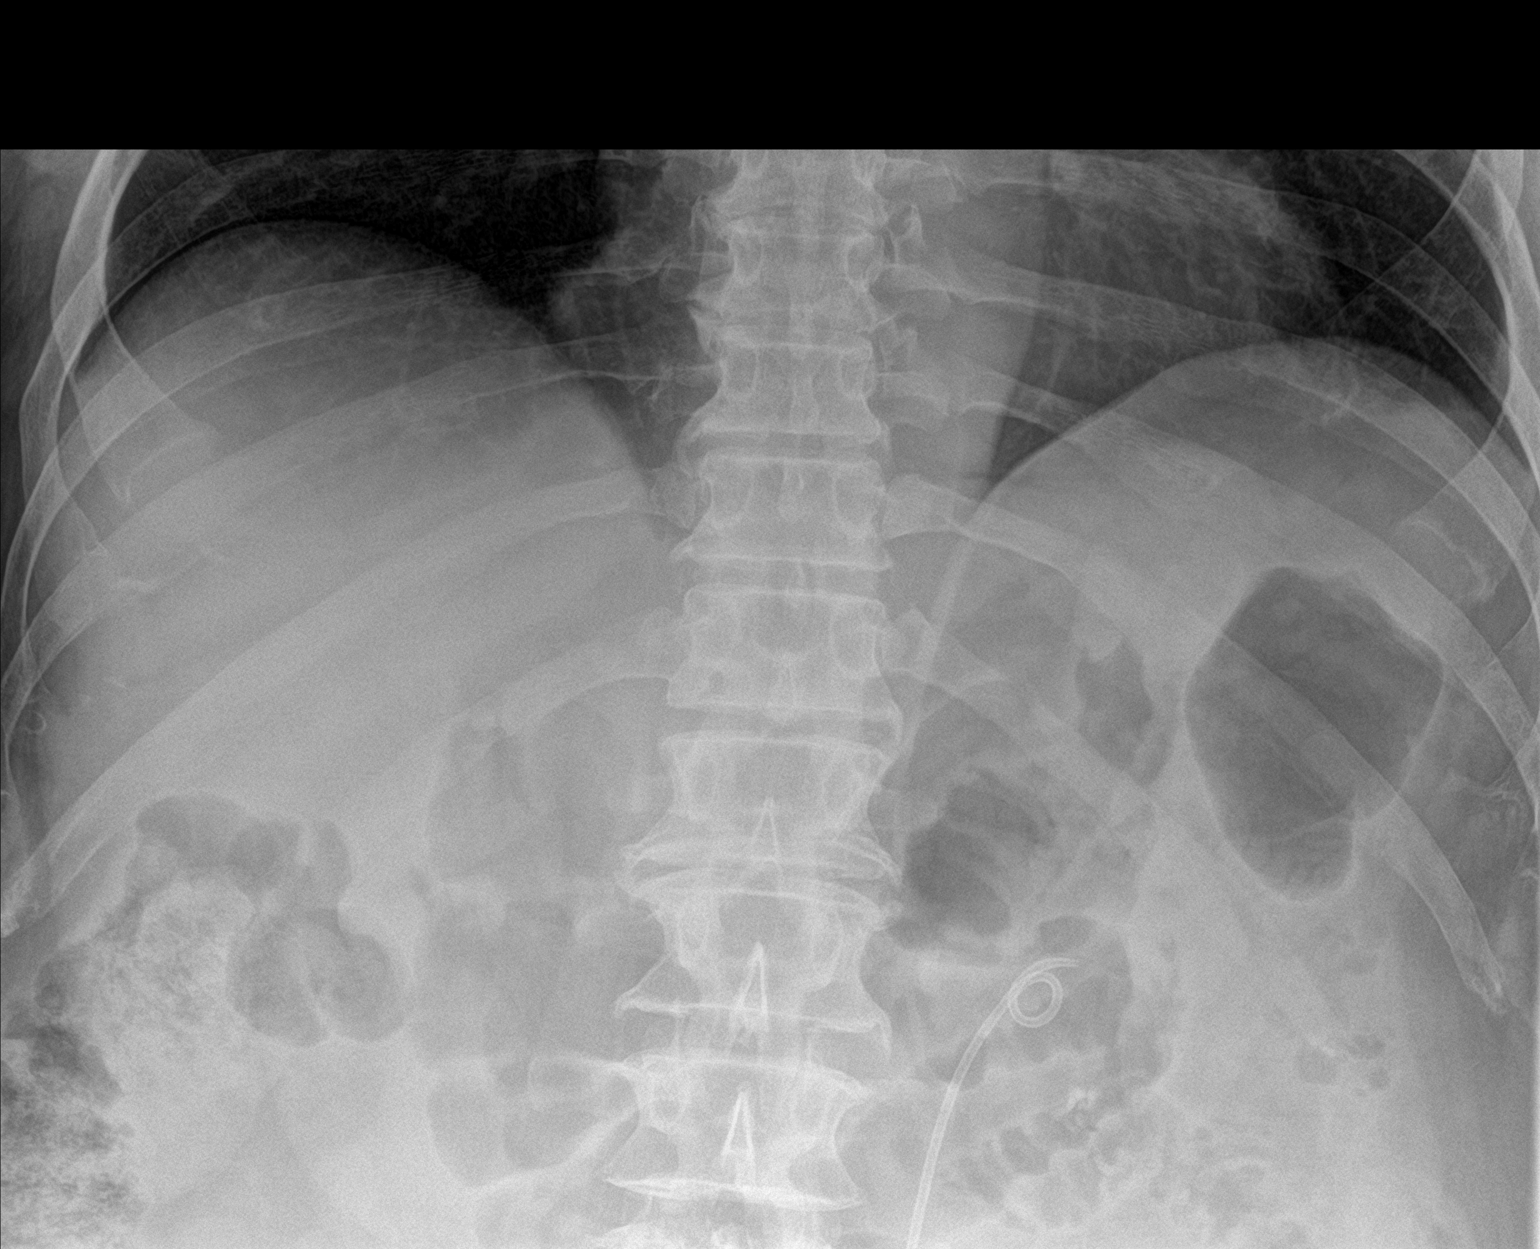

[abdomen kub (2 of 2)]
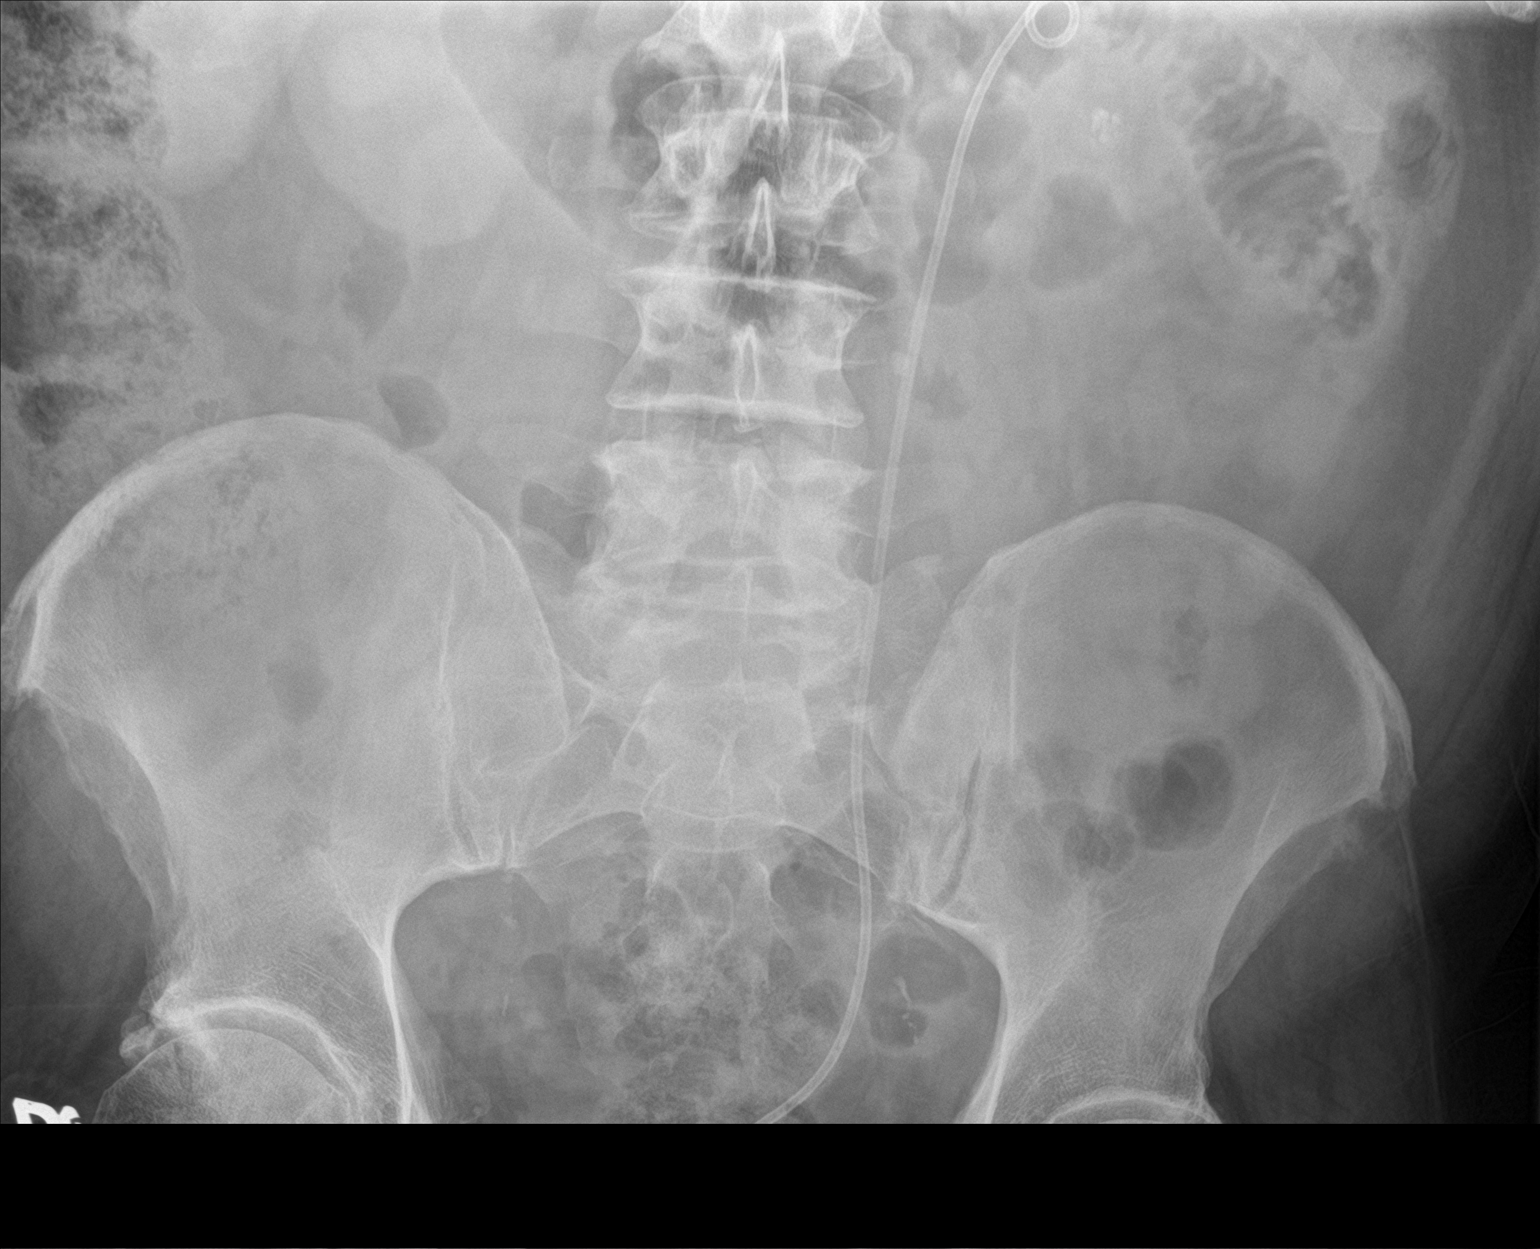

[2 of 2 positions shown; findings below may reference images not displayed]

FINDINGS: Scattered large and small bowel gas is noted. Left-sided ureteral
stent is noted in satisfactory position. Calcification is noted
along the midportion of the left ureter consistent with the ureteral
stone seen on recent CT. Multiple small renal stones are noted
clustered within the lower pole similar to that noted on the prior
exam. No new focal abnormality is seen. Degenerative changes of the
lumbar spine are noted.
IMPRESSION: Left renal and ureteral calculi with ureteral stent in place.

## 2021-01-21 ENCOUNTER — Telehealth: Payer: Self-pay | Admitting: Urology

## 2021-01-21 NOTE — Telephone Encounter (Signed)
Please call Mr. Sean Sosa and have him reschedule his missed appointment from August for microscopic hematuria and nephrolithiasis.

## 2021-01-21 NOTE — Telephone Encounter (Signed)
Called and left pt a message to call the office to get rescheduled for his missed appt. KM

## 2021-01-23 NOTE — Telephone Encounter (Signed)
I have left 2 additional messages for Sean Sosa to call the office to get rescheduled for his missed appt and still got no reply.

## 2021-01-27 NOTE — Progress Notes (Signed)
07/18/19 3:51 PM   Sean Sosa 1960-10-07 [id number]  Referring provider: Danelle Berry, NP [address] [place],  [place] [address]  Chief Complaint  Patient presents with   Benign Prostatic Hypertrophy    Urological history: 1. Nephrolithiasis -Stone composition 100% calcium oxalate monohydrate -CT Renal stone study 12/12/2014 7 mm right UVJ calculus resulting in moderate right hydroureteronephrosis and right perinephric stranding.  Nonobstructing, 6 mm left renal calculus. -Spontaneous passage. -RUS 01/18/2015 Neither kidney exhibits hydronephrosis today. Bilateral ureteral jets are observed in the bladder.  There is a stable 6 mm diameter nonobstructing lower pole stone on the left. -KUB 08/08/2018 Probable large left renal calculus. No evidence of bowel obstruction or ileus. -Contrast CT 10/02/2018 1 cm left UPJ calculus without hydronephrosis. -KUB 10/06/2018 Left renal pelvic stone unchanged. -Left ESWL 10/06/2018 -CT Renal stone study 10/11/2018 8 x 6 mm stone in the upper left ureter with left hydronephrosis, perinephric stranding, and expansive renal low-density. At least 4 left renal calculi measuring up to 6 mm. -Underwent left stent placement with Dr. Bernardo Heater on 10/12/2018 -KUB 10/19/2018 Left renal and ureteral calculi with ureteral stent in place. -Underwent left URS with Dr. Bernardo Heater on 11/08/2018 -KUB 08/08/2019 no calculi seen -KUB 01/27/2021  2. BPH with LU TS -PSA pending -I PSS 14/3  3. ED -contributing factors of age, smoker, sleep apnea, BPH, HTN and HLD -SHIM 5    HPI: Sean Sosa is a 60 y.o. male who presents today for yearly visit.  UA 3-10 RBC's  He has an occasional urinary frequency and a weak urinary stream.    Patient denies any modifying or aggravating factors.  Patient denies any gross hematuria, dysuria or suprapubic/flank pain.  Patient denies any fevers, chills, nausea or vomiting.     IPSS     Row Name 01/28/21  1500         International Prostate Symptom Score   How often have you had the sensation of not emptying your bladder? Less than half the time     How often have you had to urinate less than every two hours? About half the time     How often have you found you stopped and started again several times when you urinated? Not at All     How often have you found it difficult to postpone urination? About half the time     How often have you had a weak urinary stream? More than half the time     How often have you had to strain to start urination? Less than 1 in 5 times     How many times did you typically get up at night to urinate? 1 Time     Total IPSS Score 14       Quality of Life due to urinary symptoms   If you were to spend the rest of your life with your urinary condition just the way it is now how would you feel about that? Mixed              Score:  1-7 Mild 8-19 Moderate 20-35 Severe    Patient is still having spontaneous erections.   He denies any pain or curvature with erections.  Sildenafil does not help with ED.    SHIM     Row Name 01/28/21 1515         SHIM: Over the last 6 months:   How do you rate your confidence that you could get and  keep an erection? Very Low     When you had erections with sexual stimulation, how often were your erections hard enough for penetration (entering your partner)? Almost Never or Never     During sexual intercourse, how often were you able to maintain your erection after you had penetrated (entered) your partner? Almost Never or Never     During sexual intercourse, how difficult was it to maintain your erection to completion of intercourse? Extremely Difficult     When you attempted sexual intercourse, how often was it satisfactory for you? Almost Never or Never       SHIM Total Score   SHIM 5              Score: 1-7 Severe ED 8-11 Moderate ED 12-16 Mild-Moderate ED 17-21 Mild ED 22-25 No ED      PMH Past  Medical History:  Diagnosis Date   History of kidney stones    Hypercholesteremia    Nephrolithiasis    Sleep apnea    Patient reports sleep study was neg.    Surgical History: Past Surgical History:  Procedure Laterality Date   CYSTOSCOPY W/ RETROGRADES Left 10/12/2018   Procedure: CYSTOSCOPY WITH RETROGRADE PYELOGRAM;  Surgeon: Riki Altes, MD;  Location: [facility] OR[facility];  Service: Urology;  Laterality: Left;   CYSTOSCOPY/URETEROSCOPY/HOLMIUM LASER/STENT PLACEMENT Left 10/12/2018   Procedure: CYSTOSCOPY/URETEROSCOPY/STENT PLACEMENT;  Surgeon: Riki Altes, MD;  Location: [facility];  Service: Urology;  Laterality: Left;   CYSTOSCOPY/URETEROSCOPY/HOLMIUM LASER/STENT PLACEMENT Left 11/08/2018   Procedure: CYSTOSCOPY/URETEROSCOPY/HOLMIUM LASER/STENT PLACEMENT;  Surgeon: Riki Altes, MD;  Location: [facility];  Service: Urology;  Laterality: Left;   EXTRACORPOREAL SHOCK WAVE LITHOTRIPSY Left 10/06/2018   Procedure: EXTRACORPOREAL SHOCK WAVE LITHOTRIPSY (ESWL);  Surgeon: Riki Altes, MD;  Location: [facility];  Service: Urology;  Laterality: Left;   HAND SURGERY Left 1999   circular saw cut tendons and ligaments   HERNIA REPAIR  1970's    Home Medications:  Allergies as of 01/28/2021   No Known Allergies      Medication List        Accurate as of January 28, 2021  3:51 PM. If you have any questions, ask your nurse or doctor.          aspirin EC 81 MG tablet Take 81 mg by mouth every evening.   furosemide 20 MG tablet Commonly known as: Lasix Take 1 tablet (20 mg total) by mouth daily.   methocarbamol 500 MG tablet Commonly known as: ROBAXIN Take 1 tablet (500 mg total) by mouth 2 (two) times daily.   ondansetron 4 MG disintegrating tablet Commonly known as: Zofran ODT Take 1 tablet (4 mg total) by mouth every 8 (eight) hours as needed for nausea or vomiting.   oxybutynin 5 MG tablet Commonly known as: DITROPAN 1 tab tid prn frequency,urgency, bladder spasm    oxyCODONE-acetaminophen 5-325 MG tablet Commonly known as: Percocet Take 1 tablet by mouth every 4 (four) hours as needed for severe pain.   sildenafil 100 MG tablet Commonly known as: VIAGRA Take 1 tablet (100 mg total) by mouth daily as needed.   tamsulosin 0.4 MG Caps capsule Commonly known as: FLOMAX Take 1 capsule (0.4 mg total) by mouth daily.   triamcinolone cream 0.1 % Commonly known as: KENALOG Apply 1 application topically 2 (two) times daily.        Allergies: No Known Allergies  Family History: Family History  Problem Relation Age of Onset   Prostate  cancer Father    Hematuria Father     Social History:  reports that he has been smoking cigarettes. He has been smoking an average of 1.5 packs per day. He has never used smokeless tobacco. He reports that he does not drink alcohol and does not use drugs.   Physical Exam: BP (!) 142/86   Pulse 99   Ht 6\' 1"  (1.854 m)   Wt 295 lb (133.8 kg)   BMI 38.92 kg/m   Constitutional:  Well nourished. Alert and oriented, No acute distress. HEENT: [place] AT, mask in place.  Trachea midline Cardiovascular: No clubbing, cyanosis, or edema. Respiratory: Normal respiratory effort, no increased work of breathing. GU: No CVA tenderness.  No bladder fullness or masses.  Patient with uncircumcised phallus. Foreskin easily retracted  Urethral meatus is patent.  No penile discharge. No penile lesions or rashes. Scrotum without lesions, cysts, rashes and/or edema.  Testicles are located scrotally bilaterally. No masses are appreciated in the testicles. Left and right epididymis are normal. Rectal: Patient with  normal sphincter tone. Anus and perineum without scarring or rashes. No rectal masses are appreciated. Prostate is approximately 50 + grams, could only palpate apex and midportion, no nodules are appreciated. Seminal vesicles could not be palpated. Neurologic: Grossly intact, no focal deficits, moving all 4  extremities. Psychiatric: Normal mood and affect.    Laboratory Data: Urinalysis Component     Latest Ref Rng & Units 01/28/2021  Specific Gravity, UA     1.005 - 1.030 1.020  pH, UA     5.0 - 7.5 5.5  Color, UA     Yellow Yellow  Appearance Ur     Clear Cloudy (A)  Leukocytes,UA     Negative Negative  Protein,UA     Negative/Trace Negative  Glucose, UA     Negative Negative  Ketones, UA     Negative Negative  RBC, UA     Negative 2+ (A)  Bilirubin, UA     Negative Negative  Urobilinogen, Ur     0.2 - 1.0 mg/dL 1.0  Nitrite, UA     Negative Negative  Microscopic Examination      See below:   Component     Latest Ref Rng & Units 01/28/2021  WBC, UA     0 - 5 /hpf 0-5  RBC     0 - 2 /hpf 3-10 (A)  Epithelial Cells (non renal)     0 - 10 /hpf None seen  Bacteria, UA     None seen/Few None seen  I have reviewed the labs.   Pertinent Imaging: CT urogram pending    Assessment & Plan:    1. Nephrolithiasis  -No symptoms at this time -CT urogram pending  2. BPH with LUTS -PSA pending -DRE benign -UA micro heme -most bothersome symptoms are frequency and weak urinary stream  -continue conservative management, avoiding bladder irritants and timed voiding's -Continue tamsulosin 0.4 mg daily -cystoscopy pending   3. ED -testosterone level drawn per AUA guidelines   4. Microscopic hematuria - I explained to the patient that there are a number of causes that can be associated with blood in the urine, such as stones, BPH, UTI's, damage to the urinary tract and/or cancer. -at this time, they are in a high risk stratification for hematuria per AUA guidelines due to their age (>60 years), > 30 pack year smoking history -recommended studies for high risk are CT urogram and cysto - I explained to the  patient that a contrast material will be injected into a vein and that in rare instances, an allergic reaction can result and may even life threatening (1:100,000)   The patient denies any allergies to contrast, iodine and/or seafood and is not taking metformin. - Following the imaging study,  I've recommended a cystoscopy. I described how this is performed, typically in an office setting with a flexible cystoscope. We described the risks, benefits, and possible side effects, the most common of which is a minor amount of blood in the urine and/or burning which usually resolves in 24 to 48 hours.   - The patient had the opportunity to ask questions which were answered. Based upon this discussion, the patient is willing to proceed. Therefore, I've ordered: a CT Urogram and cystoscopy. - The patient will return following all of the above for discussion of the results.  [facility] [address] [place], [place] [address] [phone]  Zara Council, PA-C

## 2021-01-28 ENCOUNTER — Ambulatory Visit (INDEPENDENT_AMBULATORY_CARE_PROVIDER_SITE_OTHER): Payer: No Typology Code available for payment source | Admitting: Urology

## 2021-01-28 ENCOUNTER — Encounter: Payer: Self-pay | Admitting: Urology

## 2021-01-28 VITALS — BP 142/86 | HR 99 | Ht 73.0 in | Wt 295.0 lb

## 2021-01-28 DIAGNOSIS — N401 Enlarged prostate with lower urinary tract symptoms: Secondary | ICD-10-CM

## 2021-01-28 DIAGNOSIS — N200 Calculus of kidney: Secondary | ICD-10-CM

## 2021-01-28 DIAGNOSIS — R3129 Other microscopic hematuria: Secondary | ICD-10-CM | POA: Diagnosis not present

## 2021-01-28 DIAGNOSIS — N529 Male erectile dysfunction, unspecified: Secondary | ICD-10-CM

## 2021-01-28 DIAGNOSIS — N138 Other obstructive and reflux uropathy: Secondary | ICD-10-CM

## 2021-01-28 LAB — MICROSCOPIC EXAMINATION
Bacteria, UA: NONE SEEN
Epithelial Cells (non renal): NONE SEEN /hpf (ref 0–10)

## 2021-01-28 LAB — URINALYSIS, COMPLETE
Bilirubin, UA: NEGATIVE
Glucose, UA: NEGATIVE
Ketones, UA: NEGATIVE
Leukocytes,UA: NEGATIVE
Nitrite, UA: NEGATIVE
Protein,UA: NEGATIVE
Specific Gravity, UA: 1.02 (ref 1.005–1.030)
Urobilinogen, Ur: 1 mg/dL (ref 0.2–1.0)
pH, UA: 5.5 (ref 5.0–7.5)

## 2021-01-30 ENCOUNTER — Other Ambulatory Visit: Payer: Self-pay | Admitting: *Deleted

## 2021-01-30 DIAGNOSIS — R7989 Other specified abnormal findings of blood chemistry: Secondary | ICD-10-CM

## 2021-01-30 LAB — PSA: Prostate Specific Ag, Serum: 1.3 ng/mL (ref 0.0–4.0)

## 2021-01-30 LAB — TESTOSTERONE: Testosterone: 163 ng/dL — ABNORMAL LOW (ref 264–916)

## 2021-02-03 ENCOUNTER — Other Ambulatory Visit: Payer: Self-pay

## 2021-02-03 ENCOUNTER — Other Ambulatory Visit: Payer: No Typology Code available for payment source

## 2021-02-03 DIAGNOSIS — R7989 Other specified abnormal findings of blood chemistry: Secondary | ICD-10-CM

## 2021-02-04 ENCOUNTER — Other Ambulatory Visit: Payer: Self-pay | Admitting: *Deleted

## 2021-02-04 DIAGNOSIS — R7989 Other specified abnormal findings of blood chemistry: Secondary | ICD-10-CM

## 2021-02-04 LAB — TESTOSTERONE: Testosterone: 279 ng/dL (ref 264–916)

## 2021-02-04 NOTE — Addendum Note (Signed)
Addended by: Sueanne Margarita on: 02/04/2021 04:05 PM   Modules accepted: Orders

## 2021-02-14 ENCOUNTER — Other Ambulatory Visit: Payer: No Typology Code available for payment source

## 2021-02-14 ENCOUNTER — Other Ambulatory Visit: Payer: Self-pay

## 2021-02-14 DIAGNOSIS — R7989 Other specified abnormal findings of blood chemistry: Secondary | ICD-10-CM

## 2021-02-15 LAB — TESTOSTERONE: Testosterone: 268 ng/dL (ref 264–916)

## 2021-03-03 ENCOUNTER — Ambulatory Visit
Admission: RE | Admit: 2021-03-03 | Discharge: 2021-03-03 | Disposition: A | Discharge status: Home / Self Care | Payer: PRIVATE HEALTH INSURANCE | Source: Ambulatory Visit | Attending: Urology | Admitting: Urology

## 2021-03-03 ENCOUNTER — Other Ambulatory Visit: Payer: Self-pay

## 2021-03-03 DIAGNOSIS — R3129 Other microscopic hematuria: Secondary | ICD-10-CM | POA: Diagnosis present

## 2021-03-03 LAB — POCT I-STAT CREATININE: Creatinine, Ser: 0.8 mg/dL (ref 0.61–1.24)

## 2021-03-03 MED ORDER — IOHEXOL 350 MG/ML SOLN
100.0000 mL | Freq: Once | INTRAVENOUS | Status: AC | PRN
  Administered 2021-03-03: 100 mL via INTRAVENOUS

## 2021-03-06 ENCOUNTER — Encounter: Payer: Self-pay | Admitting: Urology

## 2021-03-06 ENCOUNTER — Other Ambulatory Visit: Payer: Self-pay

## 2021-03-06 ENCOUNTER — Ambulatory Visit (INDEPENDENT_AMBULATORY_CARE_PROVIDER_SITE_OTHER): Payer: No Typology Code available for payment source | Admitting: Urology

## 2021-03-06 VITALS — BP 163/90 | HR 87 | Ht 74.0 in | Wt 285.0 lb

## 2021-03-06 DIAGNOSIS — N401 Enlarged prostate with lower urinary tract symptoms: Secondary | ICD-10-CM

## 2021-03-06 DIAGNOSIS — E349 Endocrine disorder, unspecified: Secondary | ICD-10-CM

## 2021-03-06 DIAGNOSIS — N138 Other obstructive and reflux uropathy: Secondary | ICD-10-CM

## 2021-03-06 NOTE — Progress Notes (Signed)
° °  03/06/21  CC:  Chief Complaint  Patient presents with   Cysto   Indications: Microhematuria  HPI: No complaints, no gross hematuria.  CTU 03/03/2021 with small, nonobstructing left lower pole calculi.  No renal mass, hydronephrosis or ureteral/bladder abnormalities  Blood pressure (!) 163/90, pulse 87, height 6\' 2"  (1.88 m), weight 285 lb (129.3 kg). NED. A&Ox3.   No respiratory distress   Abd soft, NT, ND Normal phallus with bilateral descended testicles  Cystoscopy Procedure Note  Patient identification was confirmed, informed consent was obtained, and patient was prepped using Betadine solution.  Lidocaine jelly was administered per urethral meatus.     Pre-Procedure: - Inspection reveals a normal caliber urethral meatus.  Procedure: The flexible cystoscope was introduced without difficulty - No urethral strictures/lesions are present. -Prominent lateral lobe enlargement prostate with marked hypervascularity - Elevated bladder neck - Bilateral ureteral orifices identified - Bladder mucosa  reveals no ulcers, tumors, or lesions - No bladder stones - Mild trabeculation  Retroflexion shows prominent hypervascularity.  No median lobe or lesions   Post-Procedure: - Patient tolerated the procedure well  Assessment/ Plan: Small, nonobstructing left renal calculi No bladder mucosal abnormalities on cystoscopy Prominent BPH with hypervascularity which is the most likely source of his microhematuria He has symptoms of low testosterone.  He had 1 low level and 2 levels which were in the low normal range.  We discussed insurance will require to levels in the abnormal range before coverage.  Will schedule in a.m. lab visit with free/total testosterone    Abbie Sons, MD

## 2021-03-07 LAB — URINALYSIS, COMPLETE
Bilirubin, UA: NEGATIVE
Glucose, UA: NEGATIVE
Ketones, UA: NEGATIVE
Leukocytes,UA: NEGATIVE
Nitrite, UA: NEGATIVE
Protein,UA: NEGATIVE
Specific Gravity, UA: >1.03 — ABNORMAL HIGH (ref 1.005–1.030)
Urobilinogen, Ur: 0.2 mg/dL (ref 0.2–1.0)
pH, UA: 5.5 (ref 5.0–7.5)

## 2021-03-07 LAB — MICROSCOPIC EXAMINATION: Bacteria, UA: NONE SEEN

## 2021-03-11 ENCOUNTER — Other Ambulatory Visit: Payer: Self-pay

## 2021-03-11 ENCOUNTER — Other Ambulatory Visit: Payer: No Typology Code available for payment source

## 2021-03-11 DIAGNOSIS — E349 Endocrine disorder, unspecified: Secondary | ICD-10-CM

## 2021-03-16 LAB — TESTOSTERONE,FREE AND TOTAL
Testosterone, Free: 3.1 pg/mL — ABNORMAL LOW (ref 6.6–18.1)
Testosterone: 229 ng/dL — ABNORMAL LOW (ref 264–916)

## 2021-03-17 ENCOUNTER — Telehealth: Payer: Self-pay | Admitting: *Deleted

## 2021-03-17 NOTE — Telephone Encounter (Signed)
Notified patient as instructed, patient pleased. Discussed follow-up appointments, patient agrees  

## 2021-03-17 NOTE — Telephone Encounter (Signed)
-----   Message from Riki Altes, MD sent at 03/16/2021  9:39 AM EST ----- Repeat testosterone level was low.  Please schedule follow-up appointment with Carollee Herter or myself to discuss testosterone replacement therapy

## 2021-04-08 NOTE — Progress Notes (Signed)
07/18/19 3:31 PM   Sean Sosa 12-01-1960 [id number]  Referring provider: Danelle Berry, NP [address] [place],  [place] [address]  Chief Complaint  Patient presents with   Testosterone deficiency   Urological history: 1. Nephrolithiasis -Stone composition 100% calcium oxalate monohydrate -Spontaneous passage 7 mm right UVJ stone 2016 -Left ESWL 10/06/2018 -Underwent left stent placement with Dr. Bernardo Heater on 10/12/2018 -Underwent left URS with Dr. Bernardo Heater on 11/08/2018 -CTU 2023 - punctate nonobstructive left lower pole stones  2. BPH with LU TS -PSA 1.3 01/2021 -managed with tamsulosin 0.4 mg daily  3. ED -contributing factors of age, smoker, sleep apnea, BPH, HTN and HLD -failed PDE5i's  4. High risk hematuria -smoker -CTU 2023 - NED -cysto 2023 - prominent BPH with hypervascularity   HPI: Sean Sosa is a 61 y.o. male who presents today to discuss testosterone replacement with his wife, [name].  Component     Latest Ref Rng & Units 01/28/2021 02/03/2021 02/14/2021 03/11/2021  Testosterone     264 - 916 ng/dL 163 (L) 279 268 229 (L)  Testosterone Free     6.6 - 18.1 pg/mL    3.1 (L)   He reports fatigue, ED and his wife has noted mood swings.     PMH Past Medical History:  Diagnosis Date   History of kidney stones    Hypercholesteremia    Nephrolithiasis    Sleep apnea    Patient reports sleep study was neg.    Surgical History: Past Surgical History:  Procedure Laterality Date   CYSTOSCOPY W/ RETROGRADES Left 10/12/2018   Procedure: CYSTOSCOPY WITH RETROGRADE PYELOGRAM;  Surgeon: Abbie Sons, MD;  Location: [facility];  Service: Urology;  Laterality: Left;   CYSTOSCOPY/URETEROSCOPY/HOLMIUM LASER/STENT PLACEMENT Left 10/12/2018   Procedure: CYSTOSCOPY/URETEROSCOPY/STENT PLACEMENT;  Surgeon: Abbie Sons, MD;  Location: [facility];  Service: Urology;  Laterality: Left;   CYSTOSCOPY/URETEROSCOPY/HOLMIUM LASER/STENT PLACEMENT Left  11/08/2018   Procedure: CYSTOSCOPY/URETEROSCOPY/HOLMIUM LASER/STENT PLACEMENT;  Surgeon: Abbie Sons, MD;  Location: [facility];  Service: Urology;  Laterality: Left;   EXTRACORPOREAL SHOCK WAVE LITHOTRIPSY Left 10/06/2018   Procedure: EXTRACORPOREAL SHOCK WAVE LITHOTRIPSY (ESWL);  Surgeon: Abbie Sons, MD;  Location: [facility];  Service: Urology;  Laterality: Left;   HAND SURGERY Left 1999   circular saw cut tendons and ligaments   HERNIA REPAIR  1970's    Home Medications:  Allergies as of 04/09/2021   No Known Allergies      Medication List        Accurate as of April 09, 2021  3:31 PM. If you have any questions, ask your nurse or doctor.          aspirin EC 81 MG tablet Take 81 mg by mouth every evening.   furosemide 20 MG tablet Commonly known as: Lasix Take 1 tablet (20 mg total) by mouth daily.   methocarbamol 500 MG tablet Commonly known as: ROBAXIN Take 1 tablet (500 mg total) by mouth 2 (two) times daily.   ondansetron 4 MG disintegrating tablet Commonly known as: Zofran ODT Take 1 tablet (4 mg total) by mouth every 8 (eight) hours as needed for nausea or vomiting.   oxybutynin 5 MG tablet Commonly known as: DITROPAN 1 tab tid prn frequency,urgency, bladder spasm   oxyCODONE-acetaminophen 5-325 MG tablet Commonly known as: Percocet Take 1 tablet by mouth every 4 (four) hours as needed for severe pain.   sildenafil 100 MG tablet Commonly known as: VIAGRA Take 1 tablet (  100 mg total) by mouth daily as needed.   tamsulosin 0.4 MG Caps capsule Commonly known as: FLOMAX Take 1 capsule (0.4 mg total) by mouth daily.   testosterone cypionate 200 MG/ML injection Commonly known as: DEPOTESTOSTERONE CYPIONATE Inject 1 mL (200 mg total) into the muscle every 14 (fourteen) days. Started by: Zara Council, PA-C   triamcinolone cream 0.1 % Commonly known as: KENALOG Apply 1 application topically 2 (two) times daily.        Allergies: No Known  Allergies  Family History: Family History  Problem Relation Age of Onset   Prostate cancer Father    Hematuria Father     Social History:  reports that he has been smoking cigarettes. He has been smoking an average of 1.5 packs per day. He has never used smokeless tobacco. He reports that he does not drink alcohol and does not use drugs.   Physical Exam: BP (!) 150/85    Pulse 87    Ht 6\' 1"  (1.854 m)    Wt 281 lb (127.5 kg)    BMI 37.07 kg/m   Constitutional:  Well nourished. Alert and oriented, No acute distress. HEENT: [place] AT, mask in place. trachea midline Cardiovascular: No clubbing, cyanosis, or edema. Respiratory: Normal respiratory effort, no increased work of breathing. Neurologic: Grossly intact, no focal deficits, moving all 4 extremities. Psychiatric: Normal mood and affect.   Laboratory Data: See HPI   Pertinent Imaging: N/A   Assessment & Plan:    1.  Testosterone deficiency -Significant symptoms -Recommend starting TRT -We discussed the most common forms of replacement including intramuscular injection and gels and he desires to start injections -Rx testosterone cypionate-200 mg every 2 weeks to start -Appointment will be made for injection training -Follow-up 6 weeks after starting TRT for testosterone level and symptom check -Potential side effects of testosterone replacement were discussed including stimulation of benign prostatic growth with lower urinary tract symptoms; erythrocytosis; edema; gynecomastia; worsening sleep apnea; venous thromboembolism; testicular atrophy and infertility. Recent studies suggesting an increased incidence of heart attack and stroke in patients taking testosterone was discussed. He was informed there is conflicting evidence regarding the impact of testosterone therapy on cardiovascular risk. The theoretical risk of growth stimulation of an undetected prostate cancer was also discussed.  He was informed that current evidence does  not provide any definitive answers regarding the risks of testosterone therapy on prostate cancer and cardiovascular disease. The need for periodic monitoring of his testosterone level, PSA, hematocrit and DRE was discussed. -prolactin and LH drawn today       [facility] [address] [place], [place] [address] [phone]  Zara Council, PA-C  I spent 15 minutes on the day of the encounter to include pre-visit record review, face-to-face time with the patient, and post-visit ordering of tests.

## 2021-04-09 ENCOUNTER — Encounter: Payer: Self-pay | Admitting: Urology

## 2021-04-09 ENCOUNTER — Ambulatory Visit (INDEPENDENT_AMBULATORY_CARE_PROVIDER_SITE_OTHER): Payer: No Typology Code available for payment source | Admitting: Urology

## 2021-04-09 ENCOUNTER — Other Ambulatory Visit: Payer: Self-pay

## 2021-04-09 VITALS — BP 150/85 | HR 87 | Ht 73.0 in | Wt 281.0 lb

## 2021-04-09 DIAGNOSIS — E349 Endocrine disorder, unspecified: Secondary | ICD-10-CM | POA: Diagnosis not present

## 2021-04-09 MED ORDER — TESTOSTERONE CYPIONATE 200 MG/ML IM SOLN: 200.0000 mg | INTRAMUSCULAR | 0 refills | Status: DC

## 2021-04-10 LAB — PROLACTIN: Prolactin: 9.3 ng/mL (ref 4.0–15.2)

## 2021-04-10 LAB — LUTEINIZING HORMONE: LH: 8 m[IU]/mL (ref 1.7–8.6)

## 2021-04-11 ENCOUNTER — Telehealth: Payer: Self-pay

## 2021-04-11 NOTE — Telephone Encounter (Signed)
OK per DPR, LMOM notifying patient. 

## 2021-04-11 NOTE — Telephone Encounter (Signed)
-----   Message from Harle Battiest, PA-C sent at 04/10/2021  8:01 AM EST ----- Please let Mr. Durrell know that his blood work was normal, so we do not need to do the brain MRI.

## 2021-04-14 ENCOUNTER — Other Ambulatory Visit: Payer: Self-pay

## 2021-04-14 ENCOUNTER — Other Ambulatory Visit: Payer: Self-pay | Admitting: Urology

## 2021-04-14 ENCOUNTER — Ambulatory Visit (INDEPENDENT_AMBULATORY_CARE_PROVIDER_SITE_OTHER): Payer: No Typology Code available for payment source

## 2021-04-14 DIAGNOSIS — E291 Testicular hypofunction: Secondary | ICD-10-CM | POA: Diagnosis not present

## 2021-04-14 DIAGNOSIS — E349 Endocrine disorder, unspecified: Secondary | ICD-10-CM

## 2021-04-14 MED ORDER — SYRINGE 2-3 ML 3 ML MISC: 1.0000 mg | 3 refills | Status: DC

## 2021-04-14 MED ORDER — "BD DISP NEEDLES 21G X 1-1/2"" MISC": 1.0000 mg | 0 refills | Status: DC

## 2021-04-14 MED ORDER — BD DISP NEEDLES 18G X 1-1/2" MISC
1.0000 mg | 0 refills | Status: DC
Start: 2021-04-14 — End: 2021-04-29

## 2021-04-14 MED ORDER — TESTOSTERONE CYPIONATE 200 MG/ML IM SOLN
200.0000 mg | Freq: Once | INTRAMUSCULAR | Status: AC
  Administered 2021-04-14: 200 mg via INTRAMUSCULAR

## 2021-04-14 NOTE — Progress Notes (Signed)
IM Injection  Patient is present today for an IM Injection for treatment of testosterone deficiency. Drug: Testosterone cypionate  Dose:200mg /7mL Location:Right upper outer thigh Lot: [id number] Exp:12/2023 Patient tolerated well, no complications were noted  Performed by: Patient performed for Franchot Erichsen CMA

## 2021-04-14 NOTE — Progress Notes (Signed)
Sean Sosa is a 61 y.o.  male with testosterone deficiency who presents today for instruction on delivering an IM injection of testosterone cypionate.    I instructed the patient to identify the concentration of his testosterone. Testosterone for injection is usually in the form of testosterone cypionate. These liquids come in multiple concentrations, so before giving an injection, its very important to make sure that his intended dosage takes into account the concentration of the testosterone serum. Usually, testosterone comes in a concentration of either 100 mg/ml or 200 mg/ml.  We typically use the 200 mg/mL in this office.    Using a sterile, 18 G needle and 3 cc syringe, the testosterone cypionate was drawn up for a 31mL  cc injection.  To draw up the dose, I demonstrated how to first draw air into the syringe equal to the volume of the dosage. Then, wipe the top of the medication bottle with an alcohol wipe, insert the needle through the lid and into the medication, and push the air from your syringe into the bottle. Turn the bottle upside down and draw out the exact dosage of testosterone.  I demonstrated how to aspirate the syringe by hinge the syringe with its needle uncapped and pointing up in front of him.  Looking for air bubbles in the syringe. Flick the side of the syringe to get these bubbles to rise to the top.    When the dosage is bubble-free, I slowly depressed the plunger to force the air at the top of the syringe out stopping when a tiny drop of medication comes out of the tip of the syringe.  I advised him to be certain no air remained in the syringe as injecting air is very dangerous.  Being careful not to squirt or spray a significant portion of the dosage onto the floor.  Preparing the injection site, outer middle third of the vastus lateralis muscle of the thigh, I took a sterile alcohol pad and wipe the immediate area around where I intended him to inject.   I then  demonstrated how to change the needle from the 18 G to the 21 G needle.  I then gave him the syringe for injecting.  He correctly identified the injection location.  He held the syringe like a dart at a 90-degree angle above the sterile injection site. Quickly plunged it into the flesh. Before depressing the plunger, he drew back on it slightly and no blood was seen.  I advised him that if blood flashed in the syringe, he would need to remove the needle and then select a different location as he was in the vein.  Inject the medication at a steady, controlled pace.  He fully depressed the plunger, slowly pull the needle out. Pressing around the injection site with a sterile cotton swab as he did so - this preventing the emerging needle from pulling on the skin and causing extra pain.  We assessed the needle entry point for bleeding, and applied a sterile Band-Aid and/or cotton swab if needed. Disposed of the used needle and syringe in a proper sharps container.  I advised him to acquire a sharps container for his personal use at home.  I advised him that If, after injection, he experienced redness, swelling, or discomfort beyond that of normal soreness at the site of injection, call our office for an appointment and instructions.  He is to always store his medication at the recommended temperature, and always check the expiration date  on the bottle. If its expired, don't use it.  Of course, keep all of med's out of reach of children.  Do not change his dose without consulting your provider.  His starting dose will be 1 cc every 2 weeks.  He will return 1 week after his fourth injection for a testosterone level  Franchot Erichsen CMA

## 2021-04-25 ENCOUNTER — Other Ambulatory Visit: Payer: Self-pay | Admitting: Urology

## 2021-04-25 ENCOUNTER — Telehealth: Payer: Self-pay

## 2021-04-25 ENCOUNTER — Other Ambulatory Visit: Payer: Self-pay | Admitting: *Deleted

## 2021-04-25 ENCOUNTER — Telehealth: Payer: Self-pay | Admitting: *Deleted

## 2021-04-25 DIAGNOSIS — E349 Endocrine disorder, unspecified: Secondary | ICD-10-CM

## 2021-04-25 MED ORDER — TESTOSTERONE ENANTHATE 200 MG/ML IJ SOLN: 200.0000 mg | INTRAMUSCULAR | 0 refills | Status: DC

## 2021-04-25 NOTE — Telephone Encounter (Signed)
Fax from pharmacy stating that Testosterone Cypionate is not covered, suggested alternative is Testosterone Enanthate. Please advise ?

## 2021-04-25 NOTE — Telephone Encounter (Signed)
Sean Sosa [facility] of [facility] the [facility] received [facility]. Pharmacy corrected and pended. ?

## 2021-04-25 NOTE — Telephone Encounter (Signed)
Pt calls triage line and states that [facility] 18[facility], advised patient that RX for syringes has been sent to [facility] on 04/14/2021. Pt voiced understanding.  ?

## 2021-04-25 NOTE — Telephone Encounter (Signed)
[facility] not [facility] per fax ?

## 2021-04-29 NOTE — Telephone Encounter (Signed)
It has not been picked up due to it needing prior auth ?

## 2021-04-29 NOTE — Telephone Encounter (Signed)
Spoke with patient again and he was needed needles, looks like his prescription was sent to [facility].  I have verified with patient and [facility] uses [facility] due to [facility] insurance. [facility] removed from pt's chart. Pt paid cash for needles and I advised I will [facility]. Pt agreed. ?

## 2021-04-30 MED ORDER — TESTOSTERONE ENANTHATE 200 MG/ML IJ SOLN: 200.0000 mg | INTRAMUSCULAR | 0 refills | Status: DC

## 2021-04-30 MED ORDER — "BD DISP NEEDLES 21G X 1-1/2"" MISC": 1.0000 mg | 0 refills | Status: DC

## 2021-04-30 MED ORDER — "BD DISP NEEDLES 18G X 1-1/2"" MISC": 1.0000 mg | 0 refills | Status: DC

## 2021-06-02 ENCOUNTER — Other Ambulatory Visit: Payer: No Typology Code available for payment source

## 2021-06-02 ENCOUNTER — Other Ambulatory Visit: Payer: Self-pay | Admitting: Urology

## 2021-06-02 DIAGNOSIS — E349 Endocrine disorder, unspecified: Secondary | ICD-10-CM

## 2021-06-03 LAB — HEMOGLOBIN AND HEMATOCRIT, BLOOD
Hematocrit: 47.3 % (ref 37.5–51.0)
Hemoglobin: 16 g/dL (ref 13.0–17.7)

## 2021-06-03 LAB — TESTOSTERONE: Testosterone: 1152 ng/dL — ABNORMAL HIGH (ref 264–916)

## 2021-06-09 ENCOUNTER — Other Ambulatory Visit: Payer: Self-pay | Admitting: *Deleted

## 2021-06-09 DIAGNOSIS — R7989 Other specified abnormal findings of blood chemistry: Secondary | ICD-10-CM

## 2021-06-09 DIAGNOSIS — N138 Other obstructive and reflux uropathy: Secondary | ICD-10-CM

## 2021-06-09 DIAGNOSIS — E349 Endocrine disorder, unspecified: Secondary | ICD-10-CM

## 2021-07-06 ENCOUNTER — Other Ambulatory Visit: Payer: Self-pay | Admitting: Urology

## 2021-07-06 DIAGNOSIS — E349 Endocrine disorder, unspecified: Secondary | ICD-10-CM

## 2021-07-23 ENCOUNTER — Other Ambulatory Visit: Payer: Self-pay | Admitting: Urology

## 2021-08-12 ENCOUNTER — Encounter: Payer: Self-pay | Admitting: Urology

## 2021-08-12 ENCOUNTER — Ambulatory Visit (INDEPENDENT_AMBULATORY_CARE_PROVIDER_SITE_OTHER): Payer: No Typology Code available for payment source | Admitting: Urology

## 2021-08-12 VITALS — BP 137/87 | HR 79 | Ht 73.0 in | Wt 295.0 lb

## 2021-08-12 DIAGNOSIS — N401 Enlarged prostate with lower urinary tract symptoms: Secondary | ICD-10-CM

## 2021-08-12 DIAGNOSIS — N138 Other obstructive and reflux uropathy: Secondary | ICD-10-CM

## 2021-08-12 DIAGNOSIS — R7989 Other specified abnormal findings of blood chemistry: Secondary | ICD-10-CM

## 2021-08-12 DIAGNOSIS — N529 Male erectile dysfunction, unspecified: Secondary | ICD-10-CM

## 2021-08-12 DIAGNOSIS — E349 Endocrine disorder, unspecified: Secondary | ICD-10-CM

## 2021-08-12 LAB — MICROSCOPIC EXAMINATION: Bacteria, UA: NONE SEEN

## 2021-08-12 LAB — URINALYSIS, COMPLETE
Bilirubin, UA: NEGATIVE
Glucose, UA: NEGATIVE
Ketones, UA: NEGATIVE
Leukocytes,UA: NEGATIVE
Nitrite, UA: NEGATIVE
Protein,UA: NEGATIVE
Specific Gravity, UA: 1.03 (ref 1.005–1.030)
Urobilinogen, Ur: 0.2 mg/dL (ref 0.2–1.0)
pH, UA: 5 (ref 5.0–7.5)

## 2021-08-12 MED ORDER — TESTOSTERONE CYPIONATE 200 MG/ML IM SOLN: 100.0000 mg | INTRAMUSCULAR | 0 refills | Status: DC

## 2021-08-12 MED ORDER — TAMSULOSIN HCL 0.4 MG PO CAPS: 0.4000 mg | ORAL_CAPSULE | Freq: Every day | ORAL | 3 refills | Status: DC

## 2021-08-12 MED ORDER — SYRINGE 2-3 ML 3 ML MISC: 1.0000 mg | 3 refills | Status: DC

## 2021-08-12 MED ORDER — TADALAFIL 20 MG PO TABS: 20.0000 mg | ORAL_TABLET | Freq: Every day | ORAL | 3 refills | Status: DC | PRN

## 2021-08-12 MED ORDER — "BD DISP NEEDLES 21G X 1-1/2"" MISC": 1.0000 mg | 0 refills | Status: DC

## 2021-08-12 MED ORDER — "BD DISP NEEDLES 18G X 1-1/2"" MISC": 1.0000 mg | 0 refills | Status: DC

## 2021-08-13 LAB — PSA: Prostate Specific Ag, Serum: 1.2 ng/mL (ref 0.0–4.0)

## 2021-08-13 LAB — HEMOGLOBIN AND HEMATOCRIT, BLOOD
Hematocrit: 50.3 % (ref 37.5–51.0)
Hemoglobin: 17.2 g/dL (ref 13.0–17.7)

## 2021-08-13 LAB — TESTOSTERONE: Testosterone: 187 ng/dL — ABNORMAL LOW (ref 264–916)

## 2021-09-02 ENCOUNTER — Other Ambulatory Visit: Payer: Self-pay | Admitting: Urology

## 2021-09-02 ENCOUNTER — Telehealth: Payer: Self-pay

## 2021-09-02 DIAGNOSIS — E349 Endocrine disorder, unspecified: Secondary | ICD-10-CM

## 2021-09-02 MED ORDER — TESTOSTERONE CYPIONATE 200 MG/ML IM SOLN: 100.0000 mg | INTRAMUSCULAR | 0 refills | Status: DC

## 2021-09-02 NOTE — Telephone Encounter (Signed)
-----   Message from Harle Battiest, PA-C sent at 08/31/2021  1:28 PM EDT ----- Please let Sean Sosa know that his lab work was normal for the exception of the testosterone.  We need to get him in for repeat labs (testosterone 7 days after injection, HCT and HBG).

## 2021-09-02 NOTE — Telephone Encounter (Signed)
Pt states he has not had a testosterone injection since "April or May." He reports that when he went to pick up his Rx at [facility] at his last visit, that he was told it was going to be $386 with a GoodRx coupon. They told him they would wait on a PA for his insurance and he has not heard anything else ab it. Advised pt according to [employer], he could get 4 56mL vials for 27.87. Texted pt GoodRx coupon. [facility] [address]. Advised pt I would check back in with him tomorrow to make sure he got Rx and set up lab appt at that time.

## 2021-10-12 ENCOUNTER — Other Ambulatory Visit: Payer: Self-pay | Admitting: Urology

## 2021-10-12 DIAGNOSIS — E349 Endocrine disorder, unspecified: Secondary | ICD-10-CM

## 2021-10-12 NOTE — Telephone Encounter (Signed)
Sean Sosa need to get scheduled for follow up labs before I can refill his testosterone.  I need some clarification as there was some concern regarding the cost of the medication.  He was advised to use the [facility] Rx coupon at [facility] for a more affordable cost.  If he has been injecting himself, we need to get him scheduled to recheck his testosterone (~7 days after an injection), HCT and hemoglobin.

## 2021-10-13 NOTE — Telephone Encounter (Signed)
Pt states he has been taking testosterone. He was successful at using the GoodRx coupon to get his medication. He has been injecting 1 mL every 2 wks. He injected today and that was his last dose. Made lab appt for Testosterone and H&H labs in 1 wk. Pt confirmed appt. Advised pt Carollee Herter would follow up pending those labs.

## 2021-10-21 ENCOUNTER — Other Ambulatory Visit: Payer: Self-pay

## 2021-10-21 ENCOUNTER — Other Ambulatory Visit: Payer: No Typology Code available for payment source

## 2021-10-21 DIAGNOSIS — E349 Endocrine disorder, unspecified: Secondary | ICD-10-CM

## 2021-10-21 DIAGNOSIS — R7989 Other specified abnormal findings of blood chemistry: Secondary | ICD-10-CM

## 2021-10-22 LAB — TESTOSTERONE: Testosterone: 569 ng/dL (ref 264–916)

## 2021-10-22 LAB — HEMOGLOBIN AND HEMATOCRIT, BLOOD
Hematocrit: 43.2 % (ref 37.5–51.0)
Hemoglobin: 14.8 g/dL (ref 13.0–17.7)

## 2021-11-24 ENCOUNTER — Telehealth: Payer: Self-pay

## 2021-11-24 NOTE — Telephone Encounter (Signed)
Notified pt as advised, follow up lab and office visit appt made, pt confirmed.

## 2021-11-24 NOTE — Telephone Encounter (Signed)
-----   Message from Nori Riis, PA-C sent at 11/24/2021  3:39 PM EDT ----- Would you let Mr. Weinand know that his last labs look good? I don't see a follow up appointment, so we need to get him scheduled for December for testosterone level (one week after injection), PSA, H & H, I PSS, SHIM and exam.

## 2021-11-25 ENCOUNTER — Other Ambulatory Visit: Payer: Self-pay

## 2021-11-25 DIAGNOSIS — E349 Endocrine disorder, unspecified: Secondary | ICD-10-CM

## 2021-11-25 DIAGNOSIS — N138 Other obstructive and reflux uropathy: Secondary | ICD-10-CM

## 2022-01-12 DIAGNOSIS — I6523 Occlusion and stenosis of bilateral carotid arteries: Secondary | ICD-10-CM | POA: Insufficient documentation

## 2022-01-19 ENCOUNTER — Other Ambulatory Visit: Payer: No Typology Code available for payment source

## 2022-01-19 DIAGNOSIS — N401 Enlarged prostate with lower urinary tract symptoms: Secondary | ICD-10-CM

## 2022-01-19 DIAGNOSIS — E349 Endocrine disorder, unspecified: Secondary | ICD-10-CM

## 2022-01-20 LAB — PSA: Prostate Specific Ag, Serum: 1 ng/mL (ref 0.0–4.0)

## 2022-01-20 LAB — HEMOGLOBIN AND HEMATOCRIT, BLOOD
Hematocrit: 48.4 % (ref 37.5–51.0)
Hemoglobin: 16.9 g/dL (ref 13.0–17.7)

## 2022-01-20 LAB — TESTOSTERONE: Testosterone: 193 ng/dL — ABNORMAL LOW (ref 264–916)

## 2022-01-20 NOTE — Progress Notes (Unsigned)
01/21/22 4:03 PM   Sean Sosa [birth year] [id number]  Referring provider:  Fayrene Helper, NP [address] [place],  [place] [address]  Urological history  1. Nephrolithiasis -Stone composition 100% calcium oxalate monohydrate -Spontaneous passage 7 mm right UVJ stone 2016 -Left ESWL 10/06/2018 -Underwent left stent placement with Dr. Lonna Cobb on 10/12/2018 -Underwent left URS with Dr. Lonna Cobb on 11/08/2018 -CTU 2023 - punctate nonobstructive left lower pole stones   2. BPH with LU TS -PSA (01/2022) 1.0 -I PSS 3/1 -tamsulosin 0.4 mg daily   3. ED -contributing factors of age, smoker, sleep apnea, BPH, HTN and HLD -failed PDE5i's -SHIM 7   4. High risk hematuria -smoker -CTU 2023 - NED -cysto 2023 - prominent BPH with hypervascularity  -no reports of gross heme   5. Testosterone deficiency  -Contributing factors of age, sleep apnea, - Managed on TRT; testosterone cypionate-200 mg every 2 weeks to  - Hemoglobin/hematocrit (01/2022) 16.9/48.4 - Testosterone (01/2022) 193  Chief Complaint  Patient presents with   Benign Prostatic Hypertrophy   Erectile Dysfunction   Hematuria   Hypogonadism    HPI: Sean Sosa is a 61 y.o.male who presents today for follow up.    He has not urinary complaints.  Patient denies any modifying or aggravating factors.  Patient denies any gross hematuria, dysuria or suprapubic/flank pain.  Patient denies any fevers, chills, nausea or vomiting.      IPSS     Row Name 01/21/22 1500         International Prostate Symptom Score   How often have you had the sensation of not emptying your bladder? Not at All     How often have you had to urinate less than every two hours? Less than 1 in 5 times     How often have you found you stopped and started again several times when you urinated? Not at All     How often have you found it difficult to postpone urination? Less than 1 in 5 times     How often have you had a weak  urinary stream? Not at All     How often have you had to strain to start urination? Not at All     How many times did you typically get up at night to urinate? 1 Time     Total IPSS Score 3       Quality of Life due to urinary symptoms   If you were to spend the rest of your life with your urinary condition just the way it is now how would you feel about that? Pleased               Score:  1-7 Mild 8-19 Moderate 20-35 Severe   Patient still having spontaneous erections.  He denies any pain or curvature with erections.   Substandard erections with PDE5i's.   SHIM     Row Name 01/21/22 1545         SHIM: Over the last 6 months:   How do you rate your confidence that you could get and keep an erection? Very Low     When you had erections with sexual stimulation, how often were your erections hard enough for penetration (entering your partner)? A Few Times (much less than half the time)     During sexual intercourse, how often were you able to maintain your erection after you had penetrated (entered) your partner? Almost Never or Never     During  sexual intercourse, how difficult was it to maintain your erection to completion of intercourse? Extremely Difficult     When you attempted sexual intercourse, how often was it satisfactory for you? A Few Times (much less than half the time)       SHIM Total Score   SHIM 7               Score: 1-7 Severe ED 8-11 Moderate ED 12-16 Mild-Moderate ED 17-21 Mild ED 22-25 No ED    PMH: Past Medical History:  Diagnosis Date   History of kidney stones    Hypercholesteremia    Nephrolithiasis    Sleep apnea    Patient reports sleep study was neg.    Surgical History: Past Surgical History:  Procedure Laterality Date   CYSTOSCOPY W/ RETROGRADES Left 10/12/2018   Procedure: CYSTOSCOPY WITH RETROGRADE PYELOGRAM;  Surgeon: Riki Altes, MD;  Location: [facility];  Service: Urology;  Laterality: Left;    CYSTOSCOPY/URETEROSCOPY/HOLMIUM LASER/STENT PLACEMENT Left 10/12/2018   Procedure: CYSTOSCOPY/URETEROSCOPY/STENT PLACEMENT;  Surgeon: Riki Altes, MD;  Location: [facility];  Service: Urology;  Laterality: Left;   CYSTOSCOPY/URETEROSCOPY/HOLMIUM LASER/STENT PLACEMENT Left 11/08/2018   Procedure: CYSTOSCOPY/URETEROSCOPY/HOLMIUM LASER/STENT PLACEMENT;  Surgeon: Riki Altes, MD;  Location: [facility];  Service: Urology;  Laterality: Left;   EXTRACORPOREAL SHOCK WAVE LITHOTRIPSY Left 10/06/2018   Procedure: EXTRACORPOREAL SHOCK WAVE LITHOTRIPSY (ESWL);  Surgeon: Riki Altes, MD;  Location: [facility];  Service: Urology;  Laterality: Left;   HAND SURGERY Left 1999   circular saw cut tendons and ligaments   HERNIA REPAIR  1970's    Home Medications:  Allergies as of 62/07/2021   No Known Allergies      Medication List        Accurate as of December 62, 2023  4:03 PM. If you have any questions, ask your nurse or doctor.          2-3CC SYRINGE 3 ML Misc 1 mg by Does not apply route every 14 (fourteen) days.   aspirin EC 81 MG tablet Take 81 mg by mouth every evening.   BD Disp Needles 18G X 1-1/2" Misc Generic drug: NEEDLE (DISP) 18 G 1 mg by Does not apply route every 14 (fourteen) days.   BD Disp Needles 21G X 1-1/2" Misc Generic drug: NEEDLE (DISP) 21 G 1 mg by Does not apply route every 14 (fourteen) days.   tadalafil 20 MG tablet Commonly known as: CIALIS Take 1 tablet (20 mg total) by mouth daily as needed for erectile dysfunction.   tamsulosin 0.4 MG Caps capsule Commonly known as: FLOMAX Take 1 capsule (0.4 mg total) by mouth daily.   testosterone cypionate 200 MG/ML injection Commonly known as: DEPOTESTOSTERONE CYPIONATE Inject 1 mL (200 mg total) into the muscle every 14 (fourteen) days. What changed: how much to take        Allergies:  No Known Allergies  Family History: Family History  Problem Relation Age of Onset   Prostate cancer Father     Hematuria Father     Social History:  reports that he has been smoking cigarettes. He has been smoking an average of 1.5 packs per day. He has never used smokeless tobacco. He reports that he does not drink alcohol and does not use drugs.   Physical Exam: BP 126/84   Pulse 76   Ht 6\' 1"  (1.854 m)   Wt 287 lb (130.2 kg)   BMI 37.87 kg/m   Constitutional:  Well  nourished. Alert and oriented, No acute distress. HEENT: [place] AT, moist mucus membranes.  Trachea midline Cardiovascular: No clubbing, cyanosis, or edema. Respiratory: Normal respiratory effort, no increased work of breathing. Neurologic: Grossly intact, no focal deficits, moving all 4 extremities. Psychiatric: Normal mood and affect.    Laboratory Data: Results for orders placed or performed in visit on 01/19/22  Hemoglobin and hematocrit, blood  Result Value Ref Range   Hemoglobin 16.9 13.0 - 17.7 g/dL   Hematocrit 24.4 62.8 - 51.0 %  Testosterone  Result Value Ref Range   Testosterone 193 (L) 264 - 916 ng/dL  PSA  Result Value Ref Range   Prostate Specific Ag, Serum 1.0 0.0 - 4.0 ng/mL   I have reviewed the labs.    Assessment & Plan:    1.  Testosterone deficiency -Testosterone level subtherapeutic  -Hemoglobin and hematocrit are normal  -refilled testosterone cypionate   2. BPH with LUTS -PSA stable -most bothersome symptoms are mild urgency  -continue conservative management, avoiding bladder irritants and timed voiding's -Tamsulosin 0.4 mg daily  3. ED -Sildenafil did not provide good response -tadalafil 20 mg on-demand dosing  4. High risk hematuria -smoker -Work-up in 2023 prostatic hypervascularity and nephrolithiasis -No reports of gross heme -We will continue to monitor at this time and repeat the study for episodes of gross hematuria or persistent microscopic hematuria in the next 2 to 3 years    Return for testosterone (one week after 4 injection) H & Hazel Sams  [facility] [address] [place], [place] [address] [phone]

## 2022-01-21 ENCOUNTER — Encounter: Payer: Self-pay | Admitting: Urology

## 2022-01-21 ENCOUNTER — Ambulatory Visit (INDEPENDENT_AMBULATORY_CARE_PROVIDER_SITE_OTHER): Payer: No Typology Code available for payment source | Admitting: Urology

## 2022-01-21 VITALS — BP 126/84 | HR 76 | Ht 73.0 in | Wt 287.0 lb

## 2022-01-21 DIAGNOSIS — N138 Other obstructive and reflux uropathy: Secondary | ICD-10-CM | POA: Diagnosis not present

## 2022-01-21 DIAGNOSIS — N529 Male erectile dysfunction, unspecified: Secondary | ICD-10-CM

## 2022-01-21 DIAGNOSIS — N401 Enlarged prostate with lower urinary tract symptoms: Secondary | ICD-10-CM

## 2022-01-21 DIAGNOSIS — E349 Endocrine disorder, unspecified: Secondary | ICD-10-CM

## 2022-01-21 DIAGNOSIS — E291 Testicular hypofunction: Secondary | ICD-10-CM | POA: Diagnosis not present

## 2022-01-21 DIAGNOSIS — R319 Hematuria, unspecified: Secondary | ICD-10-CM

## 2022-01-21 MED ORDER — SYRINGE 2-3 ML 3 ML MISC: 1.0000 mg | 3 refills | Status: DC

## 2022-01-21 MED ORDER — "BD DISP NEEDLES 18G X 1-1/2"" MISC": 1.0000 mg | 0 refills | Status: DC

## 2022-01-21 MED ORDER — TESTOSTERONE CYPIONATE 200 MG/ML IM SOLN: 200.0000 mg | INTRAMUSCULAR | 0 refills | Status: DC

## 2022-01-21 MED ORDER — "BD DISP NEEDLES 21G X 1-1/2"" MISC": 1.0000 mg | 0 refills | Status: DC

## 2022-01-22 ENCOUNTER — Ambulatory Visit: Payer: No Typology Code available for payment source | Admitting: Urology

## 2022-01-22 ENCOUNTER — Telehealth: Payer: Self-pay | Admitting: *Deleted

## 2022-01-22 NOTE — Telephone Encounter (Signed)
[facility] states when pt is having a dose increase we need to write it in the comments. Pharmacist states on 11/22/21 he was rx'd a supply at 0.46ml q14 days he was getting 1 vial per month. They will try and override with the new RX.

## 2022-03-13 ENCOUNTER — Other Ambulatory Visit: Payer: Self-pay

## 2022-03-13 DIAGNOSIS — E291 Testicular hypofunction: Secondary | ICD-10-CM

## 2022-03-16 ENCOUNTER — Other Ambulatory Visit: Payer: No Typology Code available for payment source

## 2022-03-16 DIAGNOSIS — E291 Testicular hypofunction: Secondary | ICD-10-CM

## 2022-03-17 ENCOUNTER — Telehealth: Payer: Self-pay | Admitting: Family Medicine

## 2022-03-17 LAB — TESTOSTERONE: Testosterone: 898 ng/dL (ref 264–916)

## 2022-03-17 LAB — HEMOGLOBIN AND HEMATOCRIT, BLOOD
Hematocrit: 46.5 % (ref 37.5–51.0)
Hemoglobin: 15.6 g/dL (ref 13.0–17.7)

## 2022-03-17 NOTE — Telephone Encounter (Signed)
-----  Message from Nori Riis, PA-C sent at 03/17/2022  9:33 AM EST ----- Please let Mr. Gendreau know that his testosterone is at therapeutic levels and his hemoglobin and hematocrit are normal.   Please have him schedule a follow up appointment in 6 months for I PSS, SHIM and exam, we will need a testosterone level (midway between injections), PSA and H & H prior.

## 2022-03-17 NOTE — Telephone Encounter (Signed)
Patient notified and appointments have been made. He is going to need refills on the testosterone.

## 2022-03-18 ENCOUNTER — Other Ambulatory Visit: Payer: Self-pay | Admitting: Urology

## 2022-03-18 DIAGNOSIS — E349 Endocrine disorder, unspecified: Secondary | ICD-10-CM

## 2022-03-18 MED ORDER — TESTOSTERONE CYPIONATE 200 MG/ML IM SOLN: 200.0000 mg | INTRAMUSCULAR | 0 refills | Status: DC

## 2022-03-18 NOTE — Telephone Encounter (Signed)
Dose: 200 mg Route: Intramuscular Frequency: Every 14 days  Dispense Quantity: 4 mL Refills: 0   Note to Pharmacy: Not to exceed 5 additional fills before 03/01/2022       Sig: Inject 1 mL (200 mg total) into the muscle every 14 (fourteen) days.   Per patient

## 2022-04-20 ENCOUNTER — Other Ambulatory Visit: Payer: Self-pay | Admitting: Nurse Practitioner

## 2022-05-07 ENCOUNTER — Ambulatory Visit (INDEPENDENT_AMBULATORY_CARE_PROVIDER_SITE_OTHER): Payer: No Typology Code available for payment source | Admitting: Nurse Practitioner

## 2022-05-07 ENCOUNTER — Encounter: Payer: Self-pay | Admitting: Nurse Practitioner

## 2022-05-07 VITALS — BP 138/84 | HR 96 | Ht 73.0 in | Wt 295.2 lb

## 2022-05-07 DIAGNOSIS — R430 Anosmia: Secondary | ICD-10-CM

## 2022-05-07 DIAGNOSIS — R7303 Prediabetes: Secondary | ICD-10-CM

## 2022-05-07 DIAGNOSIS — R5383 Other fatigue: Secondary | ICD-10-CM | POA: Diagnosis not present

## 2022-05-07 DIAGNOSIS — I10 Essential (primary) hypertension: Secondary | ICD-10-CM | POA: Diagnosis not present

## 2022-05-07 DIAGNOSIS — Z720 Tobacco use: Secondary | ICD-10-CM

## 2022-05-07 NOTE — Progress Notes (Signed)
Established Patient Office Visit  Subjective:  Patient ID: Sean Sosa, male    DOB: Sep 26, 1960  Age: 62 y.o. MRN: [id number]  Chief Complaint  Patient presents with   Follow-up    6 month follow up    6 month follow up, reviewed results of Cards with patient today via [place].  Urology also reviewed.  Taking testosterone.      No other concerns at this time.   Past Medical History:  Diagnosis Date   History of kidney stones    Hypercholesteremia    Nephrolithiasis    Sleep apnea    Patient reports sleep study was neg.    Past Surgical History:  Procedure Laterality Date   CYSTOSCOPY W/ RETROGRADES Left 10/12/2018   Procedure: CYSTOSCOPY WITH RETROGRADE PYELOGRAM;  Surgeon: Abbie Sons, MD;  Location: [facility];  Service: Urology;  Laterality: Left;   CYSTOSCOPY/URETEROSCOPY/HOLMIUM LASER/STENT PLACEMENT Left 10/12/2018   Procedure: CYSTOSCOPY/URETEROSCOPY/STENT PLACEMENT;  Surgeon: Abbie Sons, MD;  Location: [facility];  Service: Urology;  Laterality: Left;   CYSTOSCOPY/URETEROSCOPY/HOLMIUM LASER/STENT PLACEMENT Left 11/08/2018   Procedure: CYSTOSCOPY/URETEROSCOPY/HOLMIUM LASER/STENT PLACEMENT;  Surgeon: Abbie Sons, MD;  Location: [facility];  Service: Urology;  Laterality: Left;   EXTRACORPOREAL SHOCK WAVE LITHOTRIPSY Left 10/06/2018   Procedure: EXTRACORPOREAL SHOCK WAVE LITHOTRIPSY (ESWL);  Surgeon: Abbie Sons, MD;  Location: [facility];  Service: Urology;  Laterality: Left;   HAND SURGERY Left 1999   circular saw cut tendons and ligaments   HERNIA REPAIR  1970's    Social History   Socioeconomic History   Marital status: Married    Spouse name: [name]   Number of children: Not on file   Years of education: Not on file   Highest education level: Not on file  Occupational History   Occupation: supervisor  Tobacco Use   Smoking status: Every Day    Packs/day: 1.5    Types: Cigarettes   Smokeless tobacco: Never  Vaping Use   Vaping Use:  Never used  Substance and Sexual Activity   Alcohol use: No   Drug use: Never   Sexual activity: Not on file  Other Topics Concern   Not on file  Social History Narrative   Not on file   Social Determinants of Health   Financial Resource Strain: Not on file  Food Insecurity: Not on file  Transportation Needs: Not on file  Physical Activity: Not on file  Stress: Not on file  Social Connections: Not on file  Intimate Partner Violence: Not on file    Family History  Problem Relation Age of Onset   Prostate cancer Father    Hematuria Father     No Known Allergies  Review of Systems  Constitutional: Negative.   HENT:  Positive for congestion.   Eyes: Negative.   Respiratory: Negative.    Cardiovascular:  Positive for leg swelling.  Gastrointestinal: Negative.   Genitourinary: Negative.   Musculoskeletal:  Positive for back pain.  Skin: Negative.   Neurological: Negative.   Endo/Heme/Allergies: Negative.   Psychiatric/Behavioral: Negative.         Objective:   BP 138/84   Pulse 96   Ht 6\' 1"  (1.854 m)   Wt 295 lb 3.2 oz (133.9 kg)   SpO2 95%   BMI 38.95 kg/m   Vitals:   05/07/22 1456  BP: 138/84  Pulse: 96  Height: 6\' 1"  (1.854 m)  Weight: 295 lb 3.2 oz (133.9 kg)  SpO2: 95%  BMI (Calculated):  38.96    Physical Exam Vitals reviewed.  Constitutional:      Appearance: Normal appearance.  HENT:     Head: Normocephalic.     Nose: Nose normal.     Mouth/Throat:     Mouth: Mucous membranes are moist.  Eyes:     Pupils: Pupils are equal, round, and reactive to light.  Cardiovascular:     Rate and Rhythm: Normal rate and regular rhythm.  Pulmonary:     Effort: Pulmonary effort is normal.     Breath sounds: Normal breath sounds.  Abdominal:     General: Bowel sounds are normal.     Palpations: Abdomen is soft.  Musculoskeletal:        General: Normal range of motion.     Cervical back: Normal range of motion and neck supple.  Skin:    General:  Skin is warm and dry.  Neurological:     Mental Status: He is alert and oriented to person, place, and time.  Psychiatric:        Mood and Affect: Mood normal.        Behavior: Behavior normal.      No results found for any visits on 05/07/22.  Recent Results (from the past 2160 hour(s))  Hemoglobin and Hematocrit, Blood     Status: None   Collection Time: 03/16/22  3:22 PM  Result Value Ref Range   Hemoglobin 15.6 13.0 - 17.7 g/dL   Hematocrit 46.5 37.5 - 51.0 %  Testosterone     Status: None   Collection Time: 03/16/22  3:22 PM  Result Value Ref Range   Testosterone 898 264 - 916 ng/dL    Comment: Adult male reference interval is based on a population of healthy nonobese males (BMI <30) between 14 and 65 years old. [place], [place] [phone]. PMID: [id number].       Assessment & Plan:   Problem List Items Addressed This Visit       Cardiovascular and Mediastinum   Essential (primary) hypertension     Other   Current tobacco use   Other Visit Diagnoses     Anosmia    -  Primary   Relevant Orders   CT MAXILLOFACIAL WO CONTRAST   Prediabetes       Relevant Orders   Hemoglobin A1c   CMP14+EGFR   Lipid panel   Other fatigue       Relevant Orders   TSH       Return in about 4 months (around 09/06/2022).   Total time spent: 35 minutes  Evern Bio, NP  05/07/2022

## 2022-05-07 NOTE — Patient Instructions (Signed)
1) Fasting labs in the next 2 weeks 2) CT sinuses - tooth broken off on possible right side and bilateral congestion x 4 years minimum 3) Follow up appt in 4 months, fsting labs prior

## 2022-05-14 ENCOUNTER — Ambulatory Visit: Payer: No Typology Code available for payment source

## 2022-05-14 DIAGNOSIS — R430 Anosmia: Secondary | ICD-10-CM | POA: Diagnosis not present

## 2022-05-19 ENCOUNTER — Other Ambulatory Visit: Payer: Self-pay | Admitting: Nurse Practitioner

## 2022-05-19 DIAGNOSIS — J328 Other chronic sinusitis: Secondary | ICD-10-CM

## 2022-05-22 ENCOUNTER — Other Ambulatory Visit: Payer: No Typology Code available for payment source

## 2022-05-23 LAB — HEMOGLOBIN A1C
Est. average glucose Bld gHb Est-mCnc: 123 mg/dL
Hgb A1c MFr Bld: 5.9 % — ABNORMAL HIGH (ref 4.8–5.6)

## 2022-05-23 LAB — CMP14+EGFR
ALT: 14 IU/L (ref 0–44)
AST: 15 IU/L (ref 0–40)
Albumin/Globulin Ratio: 1.5 (ref 1.2–2.2)
Albumin: 4 g/dL (ref 3.9–4.9)
Alkaline Phosphatase: 117 IU/L (ref 44–121)
BUN/Creatinine Ratio: 10 (ref 10–24)
BUN: 9 mg/dL (ref 8–27)
Bilirubin Total: 0.5 mg/dL (ref 0.0–1.2)
CO2: 22 mmol/L (ref 20–29)
Calcium: 9 mg/dL (ref 8.6–10.2)
Chloride: 103 mmol/L (ref 96–106)
Creatinine, Ser: 0.87 mg/dL (ref 0.76–1.27)
Globulin, Total: 2.6 g/dL (ref 1.5–4.5)
Glucose: 98 mg/dL (ref 70–99)
Potassium: 4.3 mmol/L (ref 3.5–5.2)
Sodium: 141 mmol/L (ref 134–144)
Total Protein: 6.6 g/dL (ref 6.0–8.5)
eGFR: 98 mL/min/{1.73_m2} (ref >59)

## 2022-05-23 LAB — LIPID PANEL
Chol/HDL Ratio: 5.3 ratio — ABNORMAL HIGH (ref 0.0–5.0)
Cholesterol, Total: 163 mg/dL (ref 100–199)
HDL: 31 mg/dL — ABNORMAL LOW (ref >39)
LDL Chol Calc (NIH): 116 mg/dL — ABNORMAL HIGH (ref 0–99)
Triglycerides: 87 mg/dL (ref 0–149)
VLDL Cholesterol Cal: 16 mg/dL (ref 5–40)

## 2022-05-23 LAB — TSH: TSH: 1.21 u[IU]/mL (ref 0.450–4.500)

## 2022-07-22 ENCOUNTER — Other Ambulatory Visit: Payer: Self-pay

## 2022-07-22 ENCOUNTER — Emergency Department (HOSPITAL_COMMUNITY)
Admission: EM | Admit: 2022-07-22 | Discharge: 2022-07-22 | Disposition: A | Discharge status: Home / Self Care | Payer: No Typology Code available for payment source | Attending: Emergency Medicine | Admitting: Emergency Medicine

## 2022-07-22 ENCOUNTER — Encounter (HOSPITAL_COMMUNITY): Payer: Self-pay

## 2022-07-22 DIAGNOSIS — Z7982 Long term (current) use of aspirin: Secondary | ICD-10-CM | POA: Diagnosis not present

## 2022-07-22 DIAGNOSIS — E860 Dehydration: Secondary | ICD-10-CM | POA: Insufficient documentation

## 2022-07-22 DIAGNOSIS — R109 Unspecified abdominal pain: Secondary | ICD-10-CM | POA: Insufficient documentation

## 2022-07-22 DIAGNOSIS — R197 Diarrhea, unspecified: Secondary | ICD-10-CM | POA: Diagnosis present

## 2022-07-22 DIAGNOSIS — Z79899 Other long term (current) drug therapy: Secondary | ICD-10-CM | POA: Diagnosis not present

## 2022-07-22 LAB — COMPREHENSIVE METABOLIC PANEL
ALT: 65 U/L — ABNORMAL HIGH (ref 0–44)
AST: 45 U/L — ABNORMAL HIGH (ref 15–41)
Albumin: 3.6 g/dL (ref 3.5–5.0)
Alkaline Phosphatase: 79 U/L (ref 38–126)
Anion gap: 12 (ref 5–15)
BUN: 14 mg/dL (ref 8–23)
CO2: 26 mmol/L (ref 22–32)
Calcium: 8.3 mg/dL — ABNORMAL LOW (ref 8.9–10.3)
Chloride: 95 mmol/L — ABNORMAL LOW (ref 98–111)
Creatinine, Ser: 0.8 mg/dL (ref 0.61–1.24)
GFR, Estimated: >60 mL/min (ref >60)
Glucose, Bld: 119 mg/dL — ABNORMAL HIGH (ref 70–99)
Potassium: 3.2 mmol/L — ABNORMAL LOW (ref 3.5–5.1)
Sodium: 133 mmol/L — ABNORMAL LOW (ref 135–145)
Total Bilirubin: 1.1 mg/dL (ref 0.3–1.2)
Total Protein: 7.7 g/dL (ref 6.5–8.1)

## 2022-07-22 LAB — CBC WITH DIFFERENTIAL/PLATELET
Abs Immature Granulocytes: 0.07 10*3/uL (ref 0.00–0.07)
Basophils Absolute: 0.1 10*3/uL (ref 0.0–0.1)
Basophils Relative: 1 %
Eosinophils Absolute: 0.1 10*3/uL (ref 0.0–0.5)
Eosinophils Relative: 1 %
HCT: 58.4 % — ABNORMAL HIGH (ref 39.0–52.0)
Hemoglobin: 18.6 g/dL — ABNORMAL HIGH (ref 13.0–17.0)
Immature Granulocytes: 1 %
Lymphocytes Relative: 18 %
Lymphs Abs: 2 10*3/uL (ref 0.7–4.0)
MCH: 30.8 pg (ref 26.0–34.0)
MCHC: 31.8 g/dL (ref 30.0–36.0)
MCV: 96.8 fL (ref 80.0–100.0)
Monocytes Absolute: 1.1 10*3/uL — ABNORMAL HIGH (ref 0.1–1.0)
Monocytes Relative: 10 %
Neutro Abs: 7.7 10*3/uL (ref 1.7–7.7)
Neutrophils Relative %: 69 %
Platelets: 247 10*3/uL (ref 150–400)
RBC: 6.03 MIL/uL — ABNORMAL HIGH (ref 4.22–5.81)
RDW: 13.7 % (ref 11.5–15.5)
WBC: 11 10*3/uL — ABNORMAL HIGH (ref 4.0–10.5)
nRBC: 0 % (ref 0.0–0.2)

## 2022-07-22 LAB — LIPASE, BLOOD: Lipase: 27 U/L (ref 11–51)

## 2022-07-22 MED ORDER — ONDANSETRON 4 MG PO TBDP
4.0000 mg | ORAL_TABLET | Freq: Once | ORAL | Status: AC
  Administered 2022-07-22: 4 mg via ORAL
  Filled 2022-07-22: qty 1

## 2022-07-22 MED ORDER — DICYCLOMINE HCL 10 MG PO CAPS
10.0000 mg | ORAL_CAPSULE | Freq: Once | ORAL | Status: AC
  Administered 2022-07-22: 10 mg via ORAL
  Filled 2022-07-22: qty 1

## 2022-07-22 MED ORDER — POTASSIUM CHLORIDE CRYS ER 20 MEQ PO TBCR
40.0000 meq | EXTENDED_RELEASE_TABLET | Freq: Once | ORAL | Status: AC
  Administered 2022-07-22: 40 meq via ORAL
  Filled 2022-07-22: qty 4

## 2022-07-22 MED ORDER — ONDANSETRON 4 MG PO TBDP: 4.0000 mg | ORAL_TABLET | Freq: Three times a day (TID) | ORAL | 0 refills | Status: DC | PRN

## 2022-07-22 MED ORDER — DICYCLOMINE HCL 20 MG PO TABS: 20.0000 mg | ORAL_TABLET | Freq: Two times a day (BID) | ORAL | 0 refills | Status: DC | PRN

## 2022-07-22 MED ORDER — LOPERAMIDE HCL 2 MG PO CAPS
4.0000 mg | ORAL_CAPSULE | Freq: Once | ORAL | Status: AC
  Administered 2022-07-22: 4 mg via ORAL
  Filled 2022-07-22: qty 2

## 2022-07-22 MED ORDER — HYDROCODONE-ACETAMINOPHEN 5-325 MG PO TABS
1.0000 | ORAL_TABLET | Freq: Once | ORAL | Status: AC
  Administered 2022-07-22: 1 via ORAL
  Filled 2022-07-22: qty 1

## 2022-07-22 MED ORDER — SODIUM CHLORIDE 0.9 % IV BOLUS: 1000.0000 mL | Freq: Once | INTRAVENOUS | Status: DC

## 2022-07-22 NOTE — Discharge Instructions (Signed)
You were seen today for diarrhea.  This is likely viral in nature.  Take medications at home for symptom control.  Make sure that you are drinking plenty of fluids.

## 2022-07-22 NOTE — ED Notes (Signed)
Patient made aware of need for urine and stool sample.

## 2022-07-22 NOTE — ED Provider Notes (Signed)
[place] EMERGENCY DEPARTMENT AT [facility] Provider Note   CSN: [id number] Arrival date & time: 07/22/22  4098     History  Chief Complaint  Patient presents with   Abdominal Pain   Diarrhea    Sean Sosa is a 62 y.o. male.  HPI     This is a 62 year old male who presents with diarrhea.  Patient reports he had onset of diarrhea on Thursday.  He reports watery diarrhea.  On Friday he called EMS when he had a temperature of 102.  They told him he probably had a virus.  He states that he has not had any vomiting but when he eats he feels like he has a gas bubble and needs to burp.  Diarrhea subsided on Monday and he was able to go to work.  He thought he was feeling better but it returned yesterday.  He did note some blood in his stool towards the end of the weekend.  He states he had a hemorrhoid and used Preparation H and that seems to have fix the problem.  Denies any ongoing fevers.  No known sick contacts.  No recent travel.  Home Medications Prior to Admission medications   Medication Sig Start Date End Date Taking? Authorizing Provider  dicyclomine (BENTYL) 20 MG tablet Take 1 tablet (20 mg total) by mouth 2 (two) times daily as needed for spasms. 07/22/22  Yes Sarkis Rhines, Mayer Masker, MD  ondansetron (ZOFRAN-ODT) 4 MG disintegrating tablet Take 1 tablet (4 mg total) by mouth every 8 (eight) hours as needed for nausea or vomiting. 07/22/22  Yes Aviel Davalos, Mayer Masker, MD  aspirin EC 81 MG tablet Take 81 mg by mouth every evening.  11/24/13   [provider]  furosemide (LASIX) 40 MG tablet TAKE 1 TABLET BY MOUTH DAILY IN AM (FLUID PILL) 04/21/22   Orson Eva, NP  NEEDLE, DISP, 18 G (BD DISP NEEDLES) 18G X 1-1/2" MISC 1 mg by Does not apply route every 14 (fourteen) days. 01/21/22   McGowan, Carollee Herter A, PA-C  NEEDLE, DISP, 21 G (BD DISP NEEDLES) 21G X 1-1/2" MISC 1 mg by Does not apply route every 14 (fourteen) days. 01/21/22   Michiel Cowboy A, PA-C  Potassium  Chloride ER 20 MEQ TBCR Take 1 tablet by mouth daily.    [provider]  Syringe, Disposable, (2-3CC SYRINGE) 3 ML MISC 1 mg by Does not apply route every 14 (fourteen) days. 01/21/22   Michiel Cowboy A, PA-C  tadalafil (CIALIS) 20 MG tablet Take 1 tablet (20 mg total) by mouth daily as needed for erectile dysfunction. 08/12/21   Michiel Cowboy A, PA-C  tamsulosin (FLOMAX) 0.4 MG CAPS capsule Take 1 capsule (0.4 mg total) by mouth daily. 08/12/21   Michiel Cowboy A, PA-C  testosterone cypionate (DEPOTESTOSTERONE CYPIONATE) 200 MG/ML injection Inject 1 mL (200 mg total) into the muscle every 14 (fourteen) days. 03/18/22   Michiel Cowboy A, PA-C      Allergies    Patient has no known allergies.    Review of Systems   Review of Systems  Constitutional:  Positive for fever.  Gastrointestinal:  Positive for vomiting. Negative for abdominal pain and nausea.  All other systems reviewed and are negative.   Physical Exam Updated Vital Signs BP (!) 140/107   Pulse 86   Temp 97.7 F (36.5 C) (Oral)   Resp 18   Ht 1.854 m (6\' 1" )   Wt 133.8 kg   SpO2 99%  BMI 38.92 kg/m  Physical Exam Vitals and nursing note reviewed.  Constitutional:      Appearance: He is well-developed. He is obese.  HENT:     Head: Normocephalic and atraumatic.  Eyes:     Pupils: Pupils are equal, round, and reactive to light.  Cardiovascular:     Rate and Rhythm: Normal rate and regular rhythm.     Heart sounds: Normal heart sounds. No murmur heard. Pulmonary:     Effort: Pulmonary effort is normal. No respiratory distress.     Breath sounds: Normal breath sounds. No wheezing.  Abdominal:     General: Abdomen is protuberant.     Palpations: Abdomen is soft.     Tenderness: There is no abdominal tenderness. There is no rebound.  Genitourinary:    Comments: Patient declined Musculoskeletal:     Cervical back: Neck supple.  Lymphadenopathy:     Cervical: No cervical adenopathy.  Skin:     General: Skin is warm and dry.  Neurological:     Mental Status: He is alert and oriented to person, place, and time.  Psychiatric:        Mood and Affect: Mood normal.     ED Results / Procedures / Treatments   Labs (all labs ordered are listed, but only abnormal results are displayed) Labs Reviewed  CBC WITH DIFFERENTIAL/PLATELET - Abnormal; Notable for the following components:      Result Value   WBC 11.0 (*)    RBC 6.03 (*)    Hemoglobin 18.6 (*)    HCT 58.4 (*)    Monocytes Absolute 1.1 (*)    All other components within normal limits  COMPREHENSIVE METABOLIC PANEL - Abnormal; Notable for the following components:   Sodium 133 (*)    Potassium 3.2 (*)    Chloride 95 (*)    Glucose, Bld 119 (*)    Calcium 8.3 (*)    AST 45 (*)    ALT 65 (*)    All other components within normal limits  GASTROINTESTINAL PANEL BY PCR, STOOL (REPLACES STOOL CULTURE)  LIPASE, BLOOD  URINALYSIS, ROUTINE W REFLEX MICROSCOPIC    EKG None  Radiology No results found.  Procedures Procedures    Medications Ordered in ED Medications  HYDROcodone-acetaminophen (NORCO/VICODIN) 5-325 MG per tablet 1 tablet (has no administration in time range)  potassium chloride SA (KLOR-CON M) CR tablet 40 mEq (has no administration in time range)  ondansetron (ZOFRAN-ODT) disintegrating tablet 4 mg (4 mg Oral Given 07/22/22 0623)  loperamide (IMODIUM) capsule 4 mg (4 mg Oral Given 07/22/22 1610)  dicyclomine (BENTYL) capsule 10 mg (10 mg Oral Given 07/22/22 0644)  sodium chloride 0.9 % bolus 1,000 mL (1,000 mLs Intravenous Bolus 07/22/22 0644)    ED Course/ Medical Decision Making/ A&P                             Medical Decision Making Amount and/or Complexity of Data Reviewed Labs: ordered.  Risk Prescription drug management.   This patient presents to the ED for concern of diarrhea, this involves an extensive number of treatment options, and is a complaint that carries with it a high risk of  complications and morbidity.  I considered the following differential and admission for this acute, potentially life threatening condition.  The differential diagnosis includes gastroenteritis, infectious diarrhea, less likely intra-abdominal pathology such as SBO, appendicitis, cholecystitis, pancreatitis  MDM:    This is a 62 year old  male who presents with diarrhea.  Had fever on Friday.  He is overall nontoxic and vital signs are largely reassuring.  Abdomen is relatively nontender.  He reports some belching and nausea.  Given how well he is appearing on exam, would favor viral etiology.  Will obtain some labs.  Patient was given Zofran and Imodium for symptom control.  Lab work does indicate some mild dehydration with hemoconcentration.  He also has mild hypokalemia and hypochloremia with AST of 45 and ALT of 65 respectively as well.  I suspect that this was related to viral infection.  On recheck, patient states he feels better.  He is receiving fluids.  We discussed supportive measures at home.  He is unable to provide a stool sample at this time.  Do not feel that imaging is indicated.  (Labs, imaging, consults)  Labs: I Ordered, and personally interpreted labs.  The pertinent results include: CBC, CMP, lipase  Imaging Studies ordered: I ordered imaging studies including none I independently visualized and interpreted imaging. I agree with the radiologist interpretation  Additional history obtained from wife at bedside.  External records from outside source obtained and reviewed including prior evaluations  Cardiac Monitoring: The patient was maintained on a cardiac monitor.  If on the cardiac monitor, I personally viewed and interpreted the cardiac monitored which showed an underlying rhythm of: Sinus rhythm  Reevaluation: After the interventions noted above, I reevaluated the patient and found that they have :improved  Social Determinants of Health:  lives  independently  Disposition: Discharge after fluids  Co morbidities that complicate the patient evaluation  Past Medical History:  Diagnosis Date   History of kidney stones    Hypercholesteremia    Nephrolithiasis    Sleep apnea    Patient reports sleep study was neg.     Medicines Meds ordered this encounter  Medications   ondansetron (ZOFRAN-ODT) disintegrating tablet 4 mg   loperamide (IMODIUM) capsule 4 mg   dicyclomine (BENTYL) capsule 10 mg   sodium chloride 0.9 % bolus 1,000 mL   HYDROcodone-acetaminophen (NORCO/VICODIN) 5-325 MG per tablet 1 tablet   potassium chloride SA (KLOR-CON M) CR tablet 40 mEq   dicyclomine (BENTYL) 20 MG tablet    Sig: Take 1 tablet (20 mg total) by mouth 2 (two) times daily as needed for spasms.    Dispense:  20 tablet    Refill:  0   ondansetron (ZOFRAN-ODT) 4 MG disintegrating tablet    Sig: Take 1 tablet (4 mg total) by mouth every 8 (eight) hours as needed for nausea or vomiting.    Dispense:  20 tablet    Refill:  0    I have reviewed the patients home medicines and have made adjustments as needed  Problem List / ED Course: Problem List Items Addressed This Visit   None Visit Diagnoses     Diarrhea, unspecified type    -  Primary   Dehydration                       Final Clinical Impression(s) / ED Diagnoses Final diagnoses:  Diarrhea, unspecified type  Dehydration    Rx / DC Orders ED Discharge Orders          Ordered    dicyclomine (BENTYL) 20 MG tablet  2 times daily PRN        07/22/22 0658    ondansetron (ZOFRAN-ODT) 4 MG disintegrating tablet  Every 8 hours PRN  07/22/22 4098              Shon Baton, MD 07/22/22 [phone]

## 2022-07-22 NOTE — ED Triage Notes (Signed)
Pt arrives c/o abdominal pain and diarrhea. States that the diarrhea started about 6 days ago, eased off on Monday, then restarted again around midnight tonight. States a small amount of red blood coming from rectum due to hemorrhoids - has been using preparation H OTC. Also states that he has been belching frequently. Pt states that he was running a fever last Friday - temp as high as 102. Denies fevers/chills since.

## 2022-07-26 ENCOUNTER — Encounter: Payer: Self-pay | Admitting: Emergency Medicine

## 2022-07-26 ENCOUNTER — Other Ambulatory Visit: Payer: Self-pay

## 2022-07-26 ENCOUNTER — Emergency Department
Admission: EM | Admit: 2022-07-26 | Discharge: 2022-07-26 | Disposition: A | Discharge status: Home / Self Care | Payer: No Typology Code available for payment source | Attending: Emergency Medicine | Admitting: Emergency Medicine

## 2022-07-26 DIAGNOSIS — R109 Unspecified abdominal pain: Secondary | ICD-10-CM | POA: Diagnosis not present

## 2022-07-26 DIAGNOSIS — R197 Diarrhea, unspecified: Secondary | ICD-10-CM | POA: Insufficient documentation

## 2022-07-26 DIAGNOSIS — D72829 Elevated white blood cell count, unspecified: Secondary | ICD-10-CM | POA: Insufficient documentation

## 2022-07-26 LAB — COMPREHENSIVE METABOLIC PANEL
ALT: 40 U/L (ref 0–44)
AST: 31 U/L (ref 15–41)
Albumin: 3 g/dL — ABNORMAL LOW (ref 3.5–5.0)
Alkaline Phosphatase: 62 U/L (ref 38–126)
Anion gap: 11 (ref 5–15)
BUN: 7 mg/dL — ABNORMAL LOW (ref 8–23)
CO2: 24 mmol/L (ref 22–32)
Calcium: 8.2 mg/dL — ABNORMAL LOW (ref 8.9–10.3)
Chloride: 100 mmol/L (ref 98–111)
Creatinine, Ser: 0.89 mg/dL (ref 0.61–1.24)
GFR, Estimated: >60 mL/min (ref >60)
Glucose, Bld: 169 mg/dL — ABNORMAL HIGH (ref 70–99)
Potassium: 3.1 mmol/L — ABNORMAL LOW (ref 3.5–5.1)
Sodium: 135 mmol/L (ref 135–145)
Total Bilirubin: 0.4 mg/dL (ref 0.3–1.2)
Total Protein: 6.6 g/dL (ref 6.5–8.1)

## 2022-07-26 LAB — CBC
HCT: 49.4 % (ref 39.0–52.0)
Hemoglobin: 16.5 g/dL (ref 13.0–17.0)
MCH: 30.8 pg (ref 26.0–34.0)
MCHC: 33.4 g/dL (ref 30.0–36.0)
MCV: 92.3 fL (ref 80.0–100.0)
Platelets: 341 10*3/uL (ref 150–400)
RBC: 5.35 MIL/uL (ref 4.22–5.81)
RDW: 13.4 % (ref 11.5–15.5)
WBC: 13.1 10*3/uL — ABNORMAL HIGH (ref 4.0–10.5)
nRBC: 0 % (ref 0.0–0.2)

## 2022-07-26 LAB — LIPASE, BLOOD: Lipase: 27 U/L (ref 11–51)

## 2022-07-26 MED ORDER — METRONIDAZOLE 500 MG PO TABS: 500.0000 mg | ORAL_TABLET | Freq: Two times a day (BID) | ORAL | 0 refills | Status: DC

## 2022-07-26 MED ORDER — CIPROFLOXACIN HCL 500 MG PO TABS: 500.0000 mg | ORAL_TABLET | Freq: Two times a day (BID) | ORAL | 0 refills | Status: DC

## 2022-07-26 NOTE — ED Provider Notes (Signed)
[facility] ([facility]) Provider Note    Event Date/Time   First MD Initiated Contact with Patient 07/26/22 0720     (approximate)   History   Abdominal Pain   HPI  Sean Sosa is a 62 y.o. male who reports he has had diarrhea for nearly a week now.  Patient reports intermittent abdominal cramping but overall denies abdominal pain.  No nausea or vomiting.  He reports he was treated symptomatically last week after being seen at emergency department, has not had any imaging.  Symptoms were getting better but then after he had a full meal yesterday he reports this morning having diarrhea again.  Nonbloody.  No recent antibiotic use.  No history of C. difficile     Physical Exam   Triage Vital Signs: ED Triage Vitals  Enc Vitals Group     BP 07/26/22 0649 (!) 143/90     Pulse Rate 07/26/22 0649 92     Resp 07/26/22 0649 18     Temp 07/26/22 0649 98.2 F (36.8 C)     Temp src --      SpO2 07/26/22 0649 100 %     Weight 07/26/22 0648 133.8 kg (295 lb 0.3 oz)     Height 07/26/22 0648 1.854 m (6\' 1" )     Head Circumference --      Peak Flow --      Pain Score 07/26/22 0655 7     Pain Loc --      Pain Edu? --      Excl. in GC? --     Most recent vital signs: Vitals:   07/26/22 0649  BP: (!) 143/90  Pulse: 92  Resp: 18  Temp: 98.2 F (36.8 C)  SpO2: 100%     General: Awake, no distress.  CV:  Good peripheral perfusion.  Resp:  Normal effort.  Abd:  No distention.  Soft, nontender, reassuring exam Other:     ED Results / Procedures / Treatments   Labs (all labs ordered are listed, but only abnormal results are displayed) Labs Reviewed  COMPREHENSIVE METABOLIC PANEL - Abnormal; Notable for the following components:      Result Value   Potassium 3.1 (*)    Glucose, Bld 169 (*)    BUN 7 (*)    Calcium 8.2 (*)    Albumin 3.0 (*)    All other components within normal limits  CBC - Abnormal; Notable for the following components:   WBC 13.1  (*)    All other components within normal limits  LIPASE, BLOOD  URINALYSIS, ROUTINE W REFLEX MICROSCOPIC     EKG     RADIOLOGY     PROCEDURES:  Critical Care performed:   Procedures   MEDICATIONS ORDERED IN ED: Medications - No data to display   IMPRESSION / MDM / ASSESSMENT AND PLAN / ED COURSE  I reviewed the triage vital signs and the nursing notes. Patient's presentation is most consistent with acute illness / injury with system symptoms.  Patient presents with diarrhea as detailed above, intermittent abdominal cramping, symptoms have been improving, now something of a relapse.  No abdominal tenderness to palpation  Mild elevation of white blood cell count is nonspecific, not significantly changed from 4 days ago, doubt C. difficile given description no recent antibiotics.  Patient unable to give [place] a stool sample at this time.  Offered CT scan however patient declined would like to trial antibiotics first, I feel that is  reasonable for possible colitis, strict return precautions, patient agrees with this plan.        FINAL CLINICAL IMPRESSION(S) / ED DIAGNOSES   Final diagnoses:  Diarrhea, unspecified type     Rx / DC Orders   ED Discharge Orders          Ordered    ciprofloxacin (CIPRO) 500 MG tablet  2 times daily        07/26/22 0752    metroNIDAZOLE (FLAGYL) 500 MG tablet  2 times daily after meals        07/26/22 8469             Note:  This document was prepared using Dragon voice recognition software and may include unintentional dictation errors.   Jene Every, MD 07/26/22 [phone]

## 2022-07-26 NOTE — ED Triage Notes (Signed)
Pt presents ambulatory to triage via POV with complaints of lower abdominal pain with associated diarrhea x 2 weeks. Pt was seen at another facility and believed the sx had improved having fewer Bms but they haven't resolved completely. Pt rates the pain 7/10. He notes taking Pepto & kaopectate PTA with no improvement. A&Ox4 at this time. Denies CP or SOB.

## 2022-08-28 ENCOUNTER — Other Ambulatory Visit: Payer: Self-pay | Admitting: Urology

## 2022-08-28 DIAGNOSIS — N138 Other obstructive and reflux uropathy: Secondary | ICD-10-CM

## 2022-08-28 DIAGNOSIS — E291 Testicular hypofunction: Secondary | ICD-10-CM

## 2022-08-31 ENCOUNTER — Other Ambulatory Visit: Payer: No Typology Code available for payment source

## 2022-08-31 DIAGNOSIS — N138 Other obstructive and reflux uropathy: Secondary | ICD-10-CM

## 2022-08-31 DIAGNOSIS — E291 Testicular hypofunction: Secondary | ICD-10-CM

## 2022-09-02 LAB — TESTOSTERONE: Testosterone: 674 ng/dL (ref 264–916)

## 2022-09-02 LAB — HEMOGLOBIN AND HEMATOCRIT, BLOOD
Hematocrit: 47.2 % (ref 37.5–51.0)
Hemoglobin: 15.6 g/dL (ref 13.0–17.7)

## 2022-09-02 LAB — PSA: Prostate Specific Ag, Serum: 3.2 ng/mL (ref 0.0–4.0)

## 2022-09-07 ENCOUNTER — Ambulatory Visit (INDEPENDENT_AMBULATORY_CARE_PROVIDER_SITE_OTHER): Payer: No Typology Code available for payment source | Admitting: Internal Medicine

## 2022-09-07 ENCOUNTER — Encounter: Payer: Self-pay | Admitting: Internal Medicine

## 2022-09-07 VITALS — BP 129/80 | HR 98 | Ht 73.0 in | Wt 284.4 lb

## 2022-09-07 DIAGNOSIS — I10 Essential (primary) hypertension: Secondary | ICD-10-CM | POA: Diagnosis not present

## 2022-09-07 DIAGNOSIS — J328 Other chronic sinusitis: Secondary | ICD-10-CM | POA: Diagnosis not present

## 2022-09-07 NOTE — Progress Notes (Signed)
Established Patient Office Visit  Subjective:  Patient ID: Sean Sosa, male    DOB: [birth year]  Age: 62 y.o. MRN: [id number]  Chief Complaint  Patient presents with   Follow-up    F/U    No new complaints, here for lab review and medication refills. Labs reviewed and notable for normal testosterone and psa. Yet to follow up with ENT for opacification of Sinuses on CT exam.    No other concerns at this time.   Past Medical History:  Diagnosis Date   History of kidney stones    Hypercholesteremia    Nephrolithiasis    Sleep apnea    Patient reports sleep study was neg.    Past Surgical History:  Procedure Laterality Date   CYSTOSCOPY W/ RETROGRADES Left 10/12/2018   Procedure: CYSTOSCOPY WITH RETROGRADE PYELOGRAM;  Surgeon: Riki Altes, MD;  Location: [facility];  Service: Urology;  Laterality: Left;   CYSTOSCOPY/URETEROSCOPY/HOLMIUM LASER/STENT PLACEMENT Left 10/12/2018   Procedure: CYSTOSCOPY/URETEROSCOPY/STENT PLACEMENT;  Surgeon: Riki Altes, MD;  Location: [facility];  Service: Urology;  Laterality: Left;   CYSTOSCOPY/URETEROSCOPY/HOLMIUM LASER/STENT PLACEMENT Left 11/08/2018   Procedure: CYSTOSCOPY/URETEROSCOPY/HOLMIUM LASER/STENT PLACEMENT;  Surgeon: Riki Altes, MD;  Location: [facility];  Service: Urology;  Laterality: Left;   EXTRACORPOREAL SHOCK WAVE LITHOTRIPSY Left 10/06/2018   Procedure: EXTRACORPOREAL SHOCK WAVE LITHOTRIPSY (ESWL);  Surgeon: Riki Altes, MD;  Location: [facility];  Service: Urology;  Laterality: Left;   HAND SURGERY Left 1999   circular saw cut tendons and ligaments   HERNIA REPAIR  1970'[facility]    Social History   Socioeconomic History   Marital status: Married    Spouse name: [name]   Number of children: Not on file   Years of education: Not on file   Highest education level: Not on file  Occupational History   Occupation: supervisor  Tobacco Use   Smoking status: Every Day    Current packs/day: 1.50    Types:  Cigarettes   Smokeless tobacco: Never  Vaping Use   Vaping status: Never Used  Substance and Sexual Activity   Alcohol use: No   Drug use: Never   Sexual activity: Not on file  Other Topics Concern   Not on file  Social History Narrative   Not on file   Social Determinants of Health   Financial Resource Strain: Not on file  Food Insecurity: Not on file  Transportation Needs: Not on file  Physical Activity: Not on file  Stress: Not on file  Social Connections: Not on file  Intimate Partner Violence: Not on file    Family History  Problem Relation Age of Onset   Prostate cancer Father    Hematuria Father     No Known Allergies  Review of Systems  Constitutional: Negative.   HENT:  Positive for congestion.   Eyes: Negative.   Respiratory: Negative.    Cardiovascular:  Positive for leg swelling.  Gastrointestinal: Negative.   Genitourinary: Negative.   Musculoskeletal:  Positive for back pain.  Skin: Negative.   Neurological: Negative.   Endo/Heme/Allergies: Negative.   Psychiatric/Behavioral: Negative.         Objective:   BP 129/80   Pulse 98   Ht 6\' 1"  (1.854 m)   Wt 284 lb 6.4 oz (129 kg)   SpO2 92%   BMI 37.52 kg/m   Vitals:   09/07/22 1509  BP: 129/80  Pulse: 98  Height: 6\' 1"  (1.854 m)  Weight: 284 lb 6.4 oz (  129 kg)  SpO2: 92%  BMI (Calculated): 37.53    Physical Exam Vitals reviewed.  Constitutional:      Appearance: Normal appearance.  HENT:     Head: Normocephalic.     Nose: Nose normal.     Mouth/Throat:     Mouth: Mucous membranes are moist.  Eyes:     Pupils: Pupils are equal, round, and reactive to light.  Cardiovascular:     Rate and Rhythm: Normal rate and regular rhythm.  Pulmonary:     Effort: Pulmonary effort is normal.     Breath sounds: Normal breath sounds.  Abdominal:     General: Bowel sounds are normal.     Palpations: Abdomen is soft.  Musculoskeletal:        General: Normal range of motion.     Cervical  back: Normal range of motion and neck supple.  Skin:    General: Skin is warm and dry.  Neurological:     Mental Status: He is alert and oriented to person, place, and time.  Psychiatric:        Mood and Affect: Mood normal.        Behavior: Behavior normal.      No results found for any visits on 09/07/22.  Recent Results (from the past 2160 hour([facility] [place]))  CBC with Differential     Status: Abnormal   Collection Time: 07/22/22  5:50 AM  Result Value Ref Range   WBC 11.0 (H) 4.0 - 10.5 K/uL   RBC 6.03 (H) 4.22 - 5.81 MIL/uL   Hemoglobin 18.6 (H) 13.0 - 17.0 g/dL   HCT 13.0 (H) 86.5 - 78.4 %   MCV 96.8 80.0 - 100.0 fL   MCH 30.8 26.0 - 34.0 pg   MCHC 31.8 30.0 - 36.0 g/dL   RDW 69.6 29.5 - 28.4 %   Platelets 247 150 - 400 K/uL   nRBC 0.0 0.0 - 0.2 %   Neutrophils Relative % 69 %   Neutro Abs 7.7 1.7 - 7.7 K/uL   Lymphocytes Relative 18 %   Lymphs Abs 2.0 0.7 - 4.0 K/uL   Monocytes Relative 10 %   Monocytes Absolute 1.1 (H) 0.1 - 1.0 K/uL   Eosinophils Relative 1 %   Eosinophils Absolute 0.1 0.0 - 0.5 K/uL   Basophils Relative 1 %   Basophils Absolute 0.1 0.0 - 0.1 K/uL   Immature Granulocytes 1 %   Abs Immature Granulocytes 0.07 0.00 - 0.07 K/uL    Comment: Performed at [facility], [address] [place], [place] [address]  Comprehensive metabolic panel     Status: Abnormal   Collection Time: 07/22/22  5:50 AM  Result Value Ref Range   Sodium 133 (L) 135 - 145 mmol/L   Potassium 3.2 (L) 3.5 - 5.1 mmol/L    Comment: HEMOLYSIS AT THIS LEVEL MAY AFFECT RESULT   Chloride 95 (L) 98 - 111 mmol/L   CO2 26 22 - 32 mmol/L   Glucose, Bld 119 (H) 70 - 99 mg/dL    Comment: Glucose reference range applies only to samples taken after fasting for at least 8 hours.   BUN 14 8 - 23 mg/dL   Creatinine, Ser 0.10 0.61 - 1.24 mg/dL   Calcium 8.3 (L) 8.9 - 10.3 mg/dL   Total Protein 7.7 6.5 - 8.1 g/dL   Albumin 3.6 3.5 - 5.0 g/dL   AST 45 (H) 15 - 41 U/L    Comment:  HEMOLYSIS AT THIS  LEVEL MAY AFFECT RESULT   ALT 65 (H) 0 - 44 U/L    Comment: HEMOLYSIS AT THIS LEVEL MAY AFFECT RESULT   Alkaline Phosphatase 79 38 - 126 U/L   Total Bilirubin 1.1 0.3 - 1.2 mg/dL    Comment: HEMOLYSIS AT THIS LEVEL MAY AFFECT RESULT   GFR, Estimated >60 >60 mL/min    Comment: (NOTE) Calculated using the CKD-EPI Creatinine Equation (2021)    Anion gap 12 5 - 15    Comment: Performed at [facility], [address] [place], [place] [address]  Lipase, blood     Status: None   Collection Time: 07/22/22  5:50 AM  Result Value Ref Range   Lipase 27 11 - 51 U/L    Comment: Performed at [facility], [address], [place], [place] [address]  Lipase, blood     Status: None   Collection Time: 07/26/22  6:52 AM  Result Value Ref Range   Lipase 27 11 - 51 U/L    Comment: Performed at [facility], [address] [place], [place] [address]  Comprehensive metabolic panel     Status: Abnormal   Collection Time: 07/26/22  6:52 AM  Result Value Ref Range   Sodium 135 135 - 145 mmol/L   Potassium 3.1 (L) 3.5 - 5.1 mmol/L   Chloride 100 98 - 111 mmol/L   CO2 24 22 - 32 mmol/L   Glucose, Bld 169 (H) 70 - 99 mg/dL    Comment: Glucose reference range applies only to samples taken after fasting for at least 8 hours.   BUN 7 (L) 8 - 23 mg/dL   Creatinine, Ser 1.02 0.61 - 1.24 mg/dL   Calcium 8.2 (L) 8.9 - 10.3 mg/dL   Total Protein 6.6 6.5 - 8.1 g/dL   Albumin 3.0 (L) 3.5 - 5.0 g/dL   AST 31 15 - 41 U/L   ALT 40 0 - 44 U/L   Alkaline Phosphatase 62 38 - 126 U/L   Total Bilirubin 0.4 0.3 - 1.2 mg/dL   GFR, Estimated >58 >52 mL/min    Comment: (NOTE) Calculated using the CKD-EPI Creatinine Equation (2021)    Anion gap 11 5 - 15    Comment: Performed at [facility], [address] [place], [place] [address]  CBC     Status: Abnormal   Collection Time: 07/26/22  6:52 AM  Result Value Ref Range   WBC 13.1 (H) 4.0 -  10.5 K/uL   RBC 5.35 4.22 - 5.81 MIL/uL   Hemoglobin 16.5 13.0 - 17.0 g/dL   HCT 23.5 36.1 - 44.3 %   MCV 92.3 80.0 - 100.0 fL   MCH 30.8 26.0 - 34.0 pg   MCHC 33.4 30.0 - 36.0 g/dL   RDW 15.4 00.8 - 67.6 %   Platelets 341 150 - 400 K/uL   nRBC 0.0 0.0 - 0.2 %    Comment: Performed at [facility], [address] [place], [place] [address]  PSA     Status: None   Collection Time: 08/31/22  3:10 PM  Result Value Ref Range   Prostate Specific Ag, Serum 3.2 0.0 - 4.0 ng/mL    Comment: Roche ECLIA methodology. According to the American Urological Association, Serum PSA should decrease and remain at undetectable levels after radical prostatectomy. The AUA defines biochemical recurrence as an initial PSA value 0.2 ng/mL or greater followed by a subsequent confirmatory PSA value 0.2 ng/mL or greater.  Values obtained with different assay methods or kits cannot be used interchangeably. Results cannot be interpreted as absolute evidence of the presence or absence of malignant disease.   Testosterone     Status: None   Collection Time: 08/31/22  3:10 PM  Result Value Ref Range   Testosterone 674 264 - 916 ng/dL    Comment: Adult male reference interval is based on a population of healthy nonobese males (BMI <30) between 60 and 66 years old. Travison, et.al. JCEM [phone]. PMID: [id number].   Hemoglobin and Hematocrit, Blood     Status: None   Collection Time: 08/31/22  3:10 PM  Result Value Ref Range   Hemoglobin 15.6 13.0 - 17.7 g/dL   Hematocrit 62.1 30.8 - 51.0 %      Assessment & Plan:  As per problem list and f/u on ENT referral. Problem List Items Addressed This Visit       Cardiovascular and Mediastinum   Essential (primary) hypertension   Relevant Medications   rosuvastatin (CRESTOR) 5 MG tablet   Other Visit Diagnoses     Other chronic sinusitis    -  Primary       Return in about 3 months (around 12/08/2022) for lab results.   Total time  spent: 20 minutes  Luna Fuse, MD  09/07/2022   This document may have been prepared by [facility] Voice Recognition software and as such may include unintentional dictation errors.

## 2022-09-15 NOTE — Progress Notes (Signed)
09/19/22 8:30 PM   Sean Sosa Sean Sosa August 28, 1960 [id number]  Referring provider:  Orson Eva, NP [address] [place],  [place] [address]  Urological history  1. Nephrolithiasis -Stone composition 100% calcium oxalate monohydrate -Spontaneous passage 7 mm right UVJ stone 2016 -Left ESWL 10/06/2018 -Underwent left stent placement with Dr. Lonna Cobb on 10/12/2018 -Underwent left URS with Dr. Lonna Cobb on 11/08/2018 -CTU 2023 - punctate nonobstructive left lower pole stones   2. BPH with LU TS -PSA (08/2022) 3.2 -tamsulosin 0.4 mg daily   3. ED -contributing factors of age, smoker, sleep apnea, BPH, HTN and HLD -failed PDE5i's   4. High risk hematuria -smoker -CTU 2023 - NED -cysto 2023 - prominent BPH with hypervascularity   5. Testosterone deficiency  -Contributing factors of age, sleep apnea, - Managed on TRT; testosterone cypionate-200 mg every 2 weeks to  - Hemoglobin/hematocrit (08/2022) 15.6/47.2 - Testosterone (08/2022) 674  Chief Complaint  Patient presents with   exam    HPI: Sean Sosa is a 62 y.o.male who presents today for follow up.    Previous records reviewed.   His PSA is increased from 1.0-3.2 over the last year and his UA has microscopic hematuria today.  He did state that he has been battling a GI infection over the last several weeks with copious amounts of diarrhea.  He states he is finally getting over this illness and is back to normal.  I PSS 10/1  Patient denies any modifying or aggravating factors.  Patient denies any recent UTI's, gross hematuria, dysuria or suprapubic/flank pain.  Patient denies any fevers, chills, nausea or vomiting.    UA yellow clear, specific gravity greater than 1.030, 1+ heme, pH 5.5, trace protein, 2 urobilinogen, 0-5 WBCs, 11-30 RBCs, granular casts present, 0-10 epithelial cells, mucus threads present and moderate bacteria.     IPSS     Row Name 09/16/22 1500         International Prostate  Symptom Score   How often have you had the sensation of not emptying your bladder? More than half the time     How often have you had to urinate less than every two hours? Less than 1 in 5 times     How often have you found you stopped and started again several times when you urinated? Less than 1 in 5 times     How often have you found it difficult to postpone urination? Less than 1 in 5 times     How often have you had a weak urinary stream? Less than half the time     How often have you had to strain to start urination? Not at All     How many times did you typically get up at night to urinate? 1 Time     Total IPSS Score 10       Quality of Life due to urinary symptoms   If you were to spend the rest of your life with your urinary condition just the way it is now how would you feel about that? Pleased              Score:  1-7 Mild 8-19 Moderate 20-35 Severe   SHIM 8  Patient still having spontaneous erections.  He denies any pain or curvature with erections.   He is taking the tadalafil 20 mg, on-demand-dosing.  It is having subpar results.     SHIM     Row Name 09/16/22 1524  SHIM: Over the last 6 months:   How do you rate your confidence that you could get and keep an erection? Very Low     When you had erections with sexual stimulation, how often were your erections hard enough for penetration (entering your partner)? A Few Times (much less than half the time)     During sexual intercourse, how often were you able to maintain your erection after you had penetrated (entered) your partner? A Few Times (much less than half the time)     During sexual intercourse, how difficult was it to maintain your erection to completion of intercourse? Extremely Difficult     When you attempted sexual intercourse, how often was it satisfactory for you? A Few Times (much less than half the time)       SHIM Total Score   SHIM 8               Score: 1-7 Severe ED 8-11  Moderate ED 12-16 Mild-Moderate ED 17-21 Mild ED 22-25 No ED    PMH: Past Medical History:  Diagnosis Date   History of kidney stones    Hypercholesteremia    Nephrolithiasis    Sleep apnea    Patient reports sleep study was neg.    Surgical History: Past Surgical History:  Procedure Laterality Date   CYSTOSCOPY W/ RETROGRADES Left 10/12/2018   Procedure: CYSTOSCOPY WITH RETROGRADE PYELOGRAM;  Surgeon: Riki Altes, MD;  Location: [facility];  Service: Urology;  Laterality: Left;   CYSTOSCOPY/URETEROSCOPY/HOLMIUM LASER/STENT PLACEMENT Left 10/12/2018   Procedure: CYSTOSCOPY/URETEROSCOPY/STENT PLACEMENT;  Surgeon: Riki Altes, MD;  Location: [facility];  Service: Urology;  Laterality: Left;   CYSTOSCOPY/URETEROSCOPY/HOLMIUM LASER/STENT PLACEMENT Left 11/08/2018   Procedure: CYSTOSCOPY/URETEROSCOPY/HOLMIUM LASER/STENT PLACEMENT;  Surgeon: Riki Altes, MD;  Location: [facility];  Service: Urology;  Laterality: Left;   EXTRACORPOREAL SHOCK WAVE LITHOTRIPSY Left 10/06/2018   Procedure: EXTRACORPOREAL SHOCK WAVE LITHOTRIPSY (ESWL);  Surgeon: Riki Altes, MD;  Location: [facility];  Service: Urology;  Laterality: Left;   HAND SURGERY Left 1999   circular saw cut tendons and ligaments   HERNIA REPAIR  1970's    Home Medications:  Allergies as of 09/16/2022   No Known Allergies      Medication List        Accurate as of September 16, 2022 11:59 PM. If you have any questions, ask your nurse or doctor.          2-3CC SYRINGE 3 ML Misc 1 mg by Does not apply route every 14 (fourteen) days.   aspirin EC 81 MG tablet Take 81 mg by mouth every evening.   BD Disp Needles 18G X 1-1/2" Misc Generic drug: NEEDLE (DISP) 18 G 1 mg by Does not apply route every 14 (fourteen) days.   BD Disp Needles 21G X 1-1/2" Misc Generic drug: NEEDLE (DISP) 21 G 1 mg by Does not apply route every 14 (fourteen) days.   ciprofloxacin 500 MG tablet Commonly known as: Cipro Take 1 tablet  (500 mg total) by mouth 2 (two) times daily.   dicyclomine 20 MG tablet Commonly known as: BENTYL Take 1 tablet (20 mg total) by mouth 2 (two) times daily as needed for spasms.   furosemide 40 MG tablet Commonly known as: LASIX TAKE 1 TABLET BY MOUTH DAILY IN AM (FLUID PILL)   metroNIDAZOLE 500 MG tablet Commonly known as: FLAGYL Take 1 tablet (500 mg total) by mouth 2 (two) times daily after a meal.  ondansetron 4 MG disintegrating tablet Commonly known as: ZOFRAN-ODT Take 1 tablet (4 mg total) by mouth every 8 (eight) hours as needed for nausea or vomiting.   Potassium Chloride ER 20 MEQ Tbcr Take 1 tablet by mouth daily.   rosuvastatin 5 MG tablet Commonly known as: CRESTOR Take 5 mg by mouth daily.   tadalafil 20 MG tablet Commonly known as: CIALIS Take 1 tablet (20 mg total) by mouth daily as needed for erectile dysfunction. What changed: Another medication with the same name was added. Make sure you understand how and when to take each. Changed by: Michiel Cowboy   tadalafil 5 MG tablet Commonly known as: CIALIS Take 1 tablet (5 mg total) by mouth daily as needed for erectile dysfunction. What changed: You were already taking a medication with the same name, and this prescription was added. Make sure you understand how and when to take each. Changed by: Michiel Cowboy   tamsulosin 0.4 MG Caps capsule Commonly known as: FLOMAX Take 1 capsule (0.4 mg total) by mouth daily.   testosterone cypionate 200 MG/ML injection Commonly known as: DEPOTESTOSTERONE CYPIONATE Inject 1 mL (200 mg total) into the muscle every 14 (fourteen) days.        Allergies:  No Known Allergies  Family History: Family History  Problem Relation Age of Onset   Prostate cancer Father    Hematuria Father     Social History:  reports that he has been smoking cigarettes. He has never used smokeless tobacco. He reports that he does not drink alcohol and does not use  drugs.   Physical Exam: BP (!) 156/91   Pulse 81   Wt 284 lb (128.8 kg)   BMI 37.47 kg/m   Constitutional:  Well nourished. Alert and oriented, No acute distress. HEENT: [place] AT, moist mucus membranes.  Trachea midline Cardiovascular: No clubbing, cyanosis, or edema. Respiratory: Normal respiratory effort, no increased work of breathing. GU: No CVA tenderness.  No bladder fullness or masses.  Patient with circumcised phallus.   Urethral meatus is patent.  No penile discharge. No penile lesions or rashes. Scrotum without lesions, cysts, rashes and/or edema.  Testicles are located scrotally bilaterally. No masses are appreciated in the testicles. Left epididymis is normal.  Right epididymal cyst is noted. Rectal: Patient with  normal sphincter tone. Anus and perineum without scarring or rashes. No rectal masses are appreciated. Prostate is approximately 50 grams, no nodules are appreciated. Seminal vesicles could not be palpated.   Neurologic: Grossly intact, no focal deficits, moving all 4 extremities. Psychiatric: Normal mood and affect.   Laboratory Data: Results for orders placed or performed in visit on 09/16/22  CULTURE, URINE COMPREHENSIVE   Specimen: Urine   UR  Result Value Ref Range   Urine Culture, Comprehensive Preliminary report    Organism ID, Bacteria Comment   Microscopic Examination   Urine  Result Value Ref Range   WBC, UA 0-5 0 - 5 /hpf   RBC, Urine 11-30 (A) 0 - 2 /hpf   Epithelial Cells (non renal) 0-10 0 - 10 /hpf   Casts Present (A) None seen /lpf   Cast Type Granular casts (A) N/A   Mucus, UA Present (A) Not Estab.   Bacteria, UA Moderate (A) None seen/Few  Urinalysis, Complete  Result Value Ref Range   Specific Gravity, UA >1.030 (H) 1.005 - 1.030   pH, UA 5.5 5.0 - 7.5   Color, UA Yellow Yellow   Appearance Ur Clear Clear  Leukocytes,UA Negative Negative   Protein,UA Trace (A) Negative/Trace   Glucose, UA Negative Negative   Ketones, UA Negative  Negative   RBC, UA 1+ (A) Negative   Bilirubin, UA Negative Negative   Urobilinogen, Ur 2.0 (H) 0.2 - 1.0 mg/dL   Nitrite, UA Negative Negative   Microscopic Examination See below:     CMP     Component Value Date/Time   NA 135 07/26/2022 0652   NA 141 05/22/2022 0914   K 3.1 (L) 07/26/2022 0652   CL 100 07/26/2022 0652   CO2 24 07/26/2022 0652   GLUCOSE 169 (H) 07/26/2022 0652   BUN 7 (L) 07/26/2022 0652   BUN 9 05/22/2022 0914   CREATININE 0.89 07/26/2022 0652   CALCIUM 8.2 (L) 07/26/2022 0652   PROT 6.6 07/26/2022 0652   PROT 6.6 05/22/2022 0914   ALBUMIN 3.0 (L) 07/26/2022 0652   ALBUMIN 4.0 05/22/2022 0914   AST 31 07/26/2022 0652   ALT 40 07/26/2022 0652   ALKPHOS 62 07/26/2022 0652   BILITOT 0.4 07/26/2022 0652   BILITOT 0.5 05/22/2022 0914   EGFR 98 05/22/2022 0914   GFRNONAA >60 07/26/2022 0652    Component     Latest Ref Rng 05/22/2022 TSH     0.450 - 4.500 uIU/mL 1.210    Component     Latest Ref Rng 05/22/2022 Hemoglobin A1C     4.8 - 5.6 % 5.9 (H)  Est. average glucose Bld gHb Est-mCnc     mg/dL 865    Legend: (H) High  I have reviewed the labs.    Assessment & Plan:    1.  Testosterone deficiency -Testosterone level therapeutic  -Hemoglobin and hematocrit are normal  -continue testosterone cypionate 200 mg/mL, 1 cc every 14 days   2. BPH with LUTS -PSA increased  -DRE normal  -most bothersome symptoms are mild urgency  -continue conservative management, avoiding bladder irritants and timed voiding's -Tamsulosin 0.4 mg daily -Discussed that his recent GI infection may have irritated his prostate which may explain the increase in the PSA and the microscopic hematuria.  I am going to send his urine for culture and treat with culture appropriate antibiotics if necessary and then we will have him return in 2 months for repeat PSA and a recheck on his UA -I explained to him that if the PSA does not return to baseline, we will need to pursue a  prostate MRI for further evaluation to rule out prostate cancer  3. ED -Sildenafil did not provide good response -tadalafil 20 mg on-demand dosing - losing efficacy  4. High risk hematuria -smoker -Work-up in 2023 prostatic hypervascularity and nephrolithiasis -No reports of gross heme -UA w/ micro heme -urine culture is pending-if micro heme persists we may need to consider repeating imaging studies and cystoscopy -We will continue to monitor at this time and repeat the study for episodes of gross hematuria or persistent/worsening microscopic hematuria in the next 2 to 3 years   Return in about 2 months (around 11/16/2022) for repeat PSA, UA and PVR .  Cloretta Ned  [facility] [address] [place], [place] [address] [phone]

## 2022-09-16 ENCOUNTER — Encounter: Payer: Self-pay | Admitting: Urology

## 2022-09-16 ENCOUNTER — Ambulatory Visit (INDEPENDENT_AMBULATORY_CARE_PROVIDER_SITE_OTHER): Payer: No Typology Code available for payment source | Admitting: Urology

## 2022-09-16 VITALS — BP 156/91 | HR 81 | Wt 284.0 lb

## 2022-09-16 DIAGNOSIS — N529 Male erectile dysfunction, unspecified: Secondary | ICD-10-CM

## 2022-09-16 DIAGNOSIS — N138 Other obstructive and reflux uropathy: Secondary | ICD-10-CM

## 2022-09-16 DIAGNOSIS — E291 Testicular hypofunction: Secondary | ICD-10-CM

## 2022-09-16 DIAGNOSIS — N401 Enlarged prostate with lower urinary tract symptoms: Secondary | ICD-10-CM

## 2022-09-16 DIAGNOSIS — E349 Endocrine disorder, unspecified: Secondary | ICD-10-CM

## 2022-09-16 DIAGNOSIS — R319 Hematuria, unspecified: Secondary | ICD-10-CM

## 2022-09-16 LAB — URINALYSIS, COMPLETE
Bilirubin, UA: NEGATIVE
Glucose, UA: NEGATIVE
Ketones, UA: NEGATIVE
Leukocytes,UA: NEGATIVE
Nitrite, UA: NEGATIVE
Specific Gravity, UA: >1.03 — ABNORMAL HIGH (ref 1.005–1.030)
Urobilinogen, Ur: 2 mg/dL — ABNORMAL HIGH (ref 0.2–1.0)
pH, UA: 5.5 (ref 5.0–7.5)

## 2022-09-16 LAB — MICROSCOPIC EXAMINATION

## 2022-09-16 MED ORDER — TAMSULOSIN HCL 0.4 MG PO CAPS: 0.4000 mg | ORAL_CAPSULE | Freq: Every day | ORAL | 3 refills | Status: DC

## 2022-09-16 MED ORDER — TADALAFIL 5 MG PO TABS
5.0000 mg | ORAL_TABLET | Freq: Every day | ORAL | 3 refills | Status: DC | PRN
Start: 2022-09-16 — End: 2023-07-01

## 2022-09-16 MED ORDER — TESTOSTERONE CYPIONATE 200 MG/ML IM SOLN
200.0000 mg | INTRAMUSCULAR | 0 refills | Status: DC
Start: 2022-09-16 — End: 2022-12-28

## 2022-09-30 NOTE — Progress Notes (Signed)
10/01/22 4:02 PM   Sean Sosa [birth year] [id number]  Referring provider:  Orson Eva, NP No address on file  Urological history  1. Nephrolithiasis -Stone composition 100% calcium oxalate monohydrate -Spontaneous passage 7 mm right UVJ stone 2016 -Left ESWL 10/06/2018 -Underwent left stent placement with Dr. Lonna Cobb on 10/12/2018 -Underwent left URS with Dr. Lonna Cobb on 11/08/2018 -CTU 2023 - punctate nonobstructive left lower pole stones   2. BPH with LU TS -PSA (08/2022) 3.2 -tamsulosin 0.4 mg daily   3. ED -contributing factors of age, smoker, sleep apnea, BPH, HTN and HLD -failed PDE5i's   4. High risk hematuria -smoker -CTU 2023 - NED -cysto 2023 - prominent BPH with hypervascularity   5. Testosterone deficiency  -Contributing factors of age, sleep apnea, - Managed on TRT; testosterone cypionate-200 mg every 2 weeks to  - Hemoglobin/hematocrit (08/2022) 15.6/47.2 - Testosterone (08/2022) 674  Chief Complaint  Patient presents with   Follow-up    HPI: Sean Sosa is a 62 y.o.male who presents today for follow up.    Previous records reviewed.   At his visit on 09/19/2022, his PSA is increased from 1.0-3.2 over the last year and his UA has microscopic hematuria today.  He did state that he has been battling a GI infection over the last several weeks with copious amounts of diarrhea.  He states he is finally getting over this illness and is back to normal.  I PSS 10/1.  Patient denies any modifying or aggravating factors.  Patient denies any recent UTI's, gross hematuria, dysuria or suprapubic/flank pain.  Patient denies any fevers, chills, nausea or vomiting.   UA yellow clear, specific gravity greater than 1.030, 1+ heme, pH 5.5, trace protein, 2 urobilinogen, 0-5 WBCs, 11-30 RBCs, granular casts present, 0-10 epithelial cells, mucus threads present and moderate bacteria.  His urine culture was negative for infection.  He feels fine today.   Patient denies any modifying or aggravating factors.  Patient denies any recent UTI's, gross hematuria, dysuria or suprapubic/flank pain.  Patient denies any fevers, chills, nausea or vomiting.   UA yellow clear, specific gravity greater than 1.030, trace blood, pH 5.5, urobilinogen is 2, 0-5 WBCs, 11-30 RBCs, 0-10 epithelial cells, mucus threads are present and a few bacteria  PVR 47 mL     PMH: Past Medical History:  Diagnosis Date   History of kidney stones    Hypercholesteremia    Nephrolithiasis    Sleep apnea    Patient reports sleep study was neg.    Surgical History: Past Surgical History:  Procedure Laterality Date   CYSTOSCOPY W/ RETROGRADES Left 10/12/2018   Procedure: CYSTOSCOPY WITH RETROGRADE PYELOGRAM;  Surgeon: Riki Altes, MD;  Location: [facility];  Service: Urology;  Laterality: Left;   CYSTOSCOPY/URETEROSCOPY/HOLMIUM LASER/STENT PLACEMENT Left 10/12/2018   Procedure: CYSTOSCOPY/URETEROSCOPY/STENT PLACEMENT;  Surgeon: Riki Altes, MD;  Location: [facility];  Service: Urology;  Laterality: Left;   CYSTOSCOPY/URETEROSCOPY/HOLMIUM LASER/STENT PLACEMENT Left 11/08/2018   Procedure: CYSTOSCOPY/URETEROSCOPY/HOLMIUM LASER/STENT PLACEMENT;  Surgeon: Riki Altes, MD;  Location: [facility];  Service: Urology;  Laterality: Left;   EXTRACORPOREAL SHOCK WAVE LITHOTRIPSY Left 10/06/2018   Procedure: EXTRACORPOREAL SHOCK WAVE LITHOTRIPSY (ESWL);  Surgeon: Riki Altes, MD;  Location: [facility];  Service: Urology;  Laterality: Left;   HAND SURGERY Left 1999   circular saw cut tendons and ligaments   HERNIA REPAIR  1970's    Home Medications:  Allergies as of 10/01/2022   No Known Allergies  Medication List        Accurate as of October 01, 2022  4:02 PM. If you have any questions, ask your nurse or doctor.          STOP taking these medications    dicyclomine 20 MG tablet Commonly known as: BENTYL Stopped by: Denyla Cortese   ondansetron 4 MG  disintegrating tablet Commonly known as: ZOFRAN-ODT Stopped by: Ugochi Henzler       TAKE these medications    2-3CC SYRINGE 3 ML Misc 1 mg by Does not apply route every 14 (fourteen) days.   aspirin EC 81 MG tablet Take 81 mg by mouth every evening.   BD Disp Needles 18G X 1-1/2" Misc Generic drug: NEEDLE (DISP) 18 G 1 mg by Does not apply route every 14 (fourteen) days.   BD Disp Needles 21G X 1-1/2" Misc Generic drug: NEEDLE (DISP) 21 G 1 mg by Does not apply route every 14 (fourteen) days.   ciprofloxacin 500 MG tablet Commonly known as: Cipro Take 1 tablet (500 mg total) by mouth 2 (two) times daily.   furosemide 40 MG tablet Commonly known as: LASIX TAKE 1 TABLET BY MOUTH DAILY IN AM (FLUID PILL)   metroNIDAZOLE 500 MG tablet Commonly known as: FLAGYL Take 1 tablet (500 mg total) by mouth 2 (two) times daily after a meal.   Potassium Chloride ER 20 MEQ Tbcr Take 1 tablet by mouth daily.   rosuvastatin 5 MG tablet Commonly known as: CRESTOR Take 5 mg by mouth daily.   tadalafil 20 MG tablet Commonly known as: CIALIS Take 1 tablet (20 mg total) by mouth daily as needed for erectile dysfunction.   tadalafil 5 MG tablet Commonly known as: CIALIS Take 1 tablet (5 mg total) by mouth daily as needed for erectile dysfunction.   tamsulosin 0.4 MG Caps capsule Commonly known as: FLOMAX Take 1 capsule (0.4 mg total) by mouth daily.   testosterone cypionate 200 MG/ML injection Commonly known as: DEPOTESTOSTERONE CYPIONATE Inject 1 mL (200 mg total) into the muscle every 14 (fourteen) days.        Allergies:  No Known Allergies  Family History: Family History  Problem Relation Age of Onset   Prostate cancer Father    Hematuria Father     Social History:  reports that he has been smoking cigarettes. He has never used smokeless tobacco. He reports that he does not drink alcohol and does not use drugs.   Physical Exam: BP 125/80   Pulse 84    Constitutional:  Well nourished. Alert and oriented, No acute distress. HEENT: [place] AT, moist mucus membranes.  Trachea midline Cardiovascular: No clubbing, cyanosis, or edema. Respiratory: Normal respiratory effort, no increased work of breathing. Neurologic: Grossly intact, no focal deficits, moving all 4 extremities. Psychiatric: Normal mood and affect.   Laboratory Data: Urinalysis Results for orders placed or performed in visit on 10/01/22  Microscopic Examination   Urine  Result Value Ref Range   WBC, UA 0-5 0 - 5 /hpf   RBC, Urine 11-30 (A) 0 - 2 /hpf   Epithelial Cells (non renal) 0-10 0 - 10 /hpf   Mucus, UA Present (A) Not Estab.   Bacteria, UA Few None seen/Few  Urinalysis, Complete  Result Value Ref Range   Specific Gravity, UA >1.030 (H) 1.005 - 1.030   pH, UA 5.5 5.0 - 7.5   Color, UA Yellow Yellow   Appearance Ur Clear Clear   Leukocytes,UA Negative Negative  Protein,UA Negative Negative/Trace   Glucose, UA Negative Negative   Ketones, UA Negative Negative   RBC, UA Trace (A) Negative   Bilirubin, UA Negative Negative   Urobilinogen, Ur 2.0 (H) 0.2 - 1.0 mg/dL   Nitrite, UA Negative Negative   Microscopic Examination See below:   PSA, total and free  Result Value Ref Range   Prostate Specific Ag, Serum 2.0 0.0 - 4.0 ng/mL   PSA, Free 0.70 N/A ng/mL   PSA, Free Pct 35.0 %  Bladder Scan (Post Void Residual) in office  Result Value Ref Range   Scan Result 47ml     I have reviewed the labs.    Assessment & Plan:    1.  Testosterone deficiency -Testosterone level therapeutic  -Hemoglobin and hematocrit are normal  -continue testosterone cypionate 200 mg/mL, 1 cc every 14 days   2. BPH with LUTS -PSA pending -most bothersome symptoms are mild urgency  -continue conservative management, avoiding bladder irritants and timed voiding's -Tamsulosin 0.4 mg daily -may consider adding finasteride 5 mg daily if PSA does not decrease at an acceptable level or  prostate mri   3. ED -Sildenafil did not provide good response -tadalafil 20 mg on-demand dosing - losing efficacy  4. High risk hematuria -smoker -Work-up in 2023 prostatic hypervascularity and nephrolithiasis -No reports of gross heme -UA w/ micro heme  -will consider adding finasteride pending PSA results to reduce hypervascularity    Return for pending psa results .  Cloretta Ned   [facility] [address], [address] [place], [place] [address] [phone]

## 2022-10-01 ENCOUNTER — Encounter: Payer: Self-pay | Admitting: Urology

## 2022-10-01 ENCOUNTER — Ambulatory Visit: Payer: No Typology Code available for payment source | Admitting: Urology

## 2022-10-01 VITALS — BP 125/80 | HR 84

## 2022-10-01 DIAGNOSIS — N401 Enlarged prostate with lower urinary tract symptoms: Secondary | ICD-10-CM

## 2022-10-01 DIAGNOSIS — R972 Elevated prostate specific antigen [PSA]: Secondary | ICD-10-CM | POA: Diagnosis not present

## 2022-10-01 DIAGNOSIS — E291 Testicular hypofunction: Secondary | ICD-10-CM | POA: Diagnosis not present

## 2022-10-01 DIAGNOSIS — R3129 Other microscopic hematuria: Secondary | ICD-10-CM

## 2022-10-01 DIAGNOSIS — N529 Male erectile dysfunction, unspecified: Secondary | ICD-10-CM

## 2022-10-01 DIAGNOSIS — R3915 Urgency of urination: Secondary | ICD-10-CM

## 2022-10-01 LAB — URINALYSIS, COMPLETE
Bilirubin, UA: NEGATIVE
Glucose, UA: NEGATIVE
Ketones, UA: NEGATIVE
Leukocytes,UA: NEGATIVE
Nitrite, UA: NEGATIVE
Protein,UA: NEGATIVE
Specific Gravity, UA: >1.03 — ABNORMAL HIGH (ref 1.005–1.030)
Urobilinogen, Ur: 2 mg/dL — ABNORMAL HIGH (ref 0.2–1.0)
pH, UA: 5.5 (ref 5.0–7.5)

## 2022-10-01 LAB — MICROSCOPIC EXAMINATION

## 2022-10-01 LAB — BLADDER SCAN AMB NON-IMAGING

## 2022-10-02 LAB — PSA, TOTAL AND FREE
PSA, Free Pct: 35 %
PSA, Free: 0.7 ng/mL
Prostate Specific Ag, Serum: 2 ng/mL (ref 0.0–4.0)

## 2022-10-06 ENCOUNTER — Other Ambulatory Visit: Payer: Self-pay | Admitting: *Deleted

## 2022-10-06 DIAGNOSIS — R3129 Other microscopic hematuria: Secondary | ICD-10-CM

## 2022-10-06 DIAGNOSIS — E291 Testicular hypofunction: Secondary | ICD-10-CM

## 2022-10-06 DIAGNOSIS — R972 Elevated prostate specific antigen [PSA]: Secondary | ICD-10-CM

## 2022-12-08 ENCOUNTER — Encounter: Payer: Self-pay | Admitting: Cardiology

## 2022-12-08 ENCOUNTER — Ambulatory Visit (INDEPENDENT_AMBULATORY_CARE_PROVIDER_SITE_OTHER): Payer: No Typology Code available for payment source | Admitting: Cardiology

## 2022-12-08 VITALS — BP 148/84 | HR 84 | Ht 73.0 in | Wt 291.0 lb

## 2022-12-08 DIAGNOSIS — I10 Essential (primary) hypertension: Secondary | ICD-10-CM | POA: Diagnosis not present

## 2022-12-08 DIAGNOSIS — E782 Mixed hyperlipidemia: Secondary | ICD-10-CM | POA: Diagnosis not present

## 2022-12-08 DIAGNOSIS — R7303 Prediabetes: Secondary | ICD-10-CM | POA: Insufficient documentation

## 2022-12-08 DIAGNOSIS — J328 Other chronic sinusitis: Secondary | ICD-10-CM

## 2022-12-08 DIAGNOSIS — Z720 Tobacco use: Secondary | ICD-10-CM | POA: Diagnosis not present

## 2022-12-08 DIAGNOSIS — Z1329 Encounter for screening for other suspected endocrine disorder: Secondary | ICD-10-CM

## 2022-12-08 NOTE — Progress Notes (Signed)
Established Patient Office Visit  Subjective:  Patient ID: Sean Sosa, male    DOB: 24-Jun-1960  Age: 62 y.o. MRN: [id number]  Chief Complaint  Patient presents with   Follow-up    3 Months Follow Up    Patient in office for 3 month follow up. Did not have fasting blood work done. Patient reports feeling well, no complaints today.  Patient has not heard from ENT referral from April 2024. Return for fasting lab work. New referral sent to ENT.     No other concerns at this time.   Past Medical History:  Diagnosis Date   History of kidney stones    Hypercholesteremia    Nephrolithiasis    Sleep apnea    Patient reports sleep study was neg.    Past Surgical History:  Procedure Laterality Date   CYSTOSCOPY W/ RETROGRADES Left 10/12/2018   Procedure: CYSTOSCOPY WITH RETROGRADE PYELOGRAM;  Surgeon: Riki Altes, MD;  Location: [facility];  Service: Urology;  Laterality: Left;   CYSTOSCOPY/URETEROSCOPY/HOLMIUM LASER/STENT PLACEMENT Left 10/12/2018   Procedure: CYSTOSCOPY/URETEROSCOPY/STENT PLACEMENT;  Surgeon: Riki Altes, MD;  Location: [facility];  Service: Urology;  Laterality: Left;   CYSTOSCOPY/URETEROSCOPY/HOLMIUM LASER/STENT PLACEMENT Left 11/08/2018   Procedure: CYSTOSCOPY/URETEROSCOPY/HOLMIUM LASER/STENT PLACEMENT;  Surgeon: Riki Altes, MD;  Location: [facility];  Service: Urology;  Laterality: Left;   EXTRACORPOREAL SHOCK WAVE LITHOTRIPSY Left 10/06/2018   Procedure: EXTRACORPOREAL SHOCK WAVE LITHOTRIPSY (ESWL);  Surgeon: Riki Altes, MD;  Location: [facility];  Service: Urology;  Laterality: Left;   HAND SURGERY Left 1999   circular saw cut tendons and ligaments   HERNIA REPAIR  1970's    Social History   Socioeconomic History   Marital status: Married    Spouse name: [name]   Number of children: Not on file   Years of education: Not on file   Highest education level: Not on file  Occupational History   Occupation: supervisor  Tobacco Use    Smoking status: Every Day    Current packs/day: 1.50    Types: Cigarettes   Smokeless tobacco: Never  Vaping Use   Vaping status: Never Used  Substance and Sexual Activity   Alcohol use: No   Drug use: Never   Sexual activity: Not on file  Other Topics Concern   Not on file  Social History Narrative   Not on file   Social Determinants of Health   Financial Resource Strain: Not on file  Food Insecurity: Not on file  Transportation Needs: Not on file  Physical Activity: Not on file  Stress: Not on file  Social Connections: Not on file  Intimate Partner Violence: Not on file    Family History  Problem Relation Age of Onset   Prostate cancer Father    Hematuria Father     No Known Allergies  Review of Systems  Constitutional: Negative.   HENT: Negative.    Eyes: Negative.   Respiratory: Negative.  Negative for shortness of breath.   Cardiovascular: Negative.  Negative for chest pain.  Gastrointestinal: Negative.  Negative for abdominal pain, constipation and diarrhea.  Genitourinary: Negative.   Musculoskeletal:  Negative for joint pain and myalgias.  Skin: Negative.   Neurological: Negative.  Negative for dizziness and headaches.  Endo/Heme/Allergies: Negative.   All other systems reviewed and are negative.      Objective:   BP (!) 148/84   Pulse 84   Ht 6\' 1"  (1.854 m)   Wt 291 lb (132 kg)  SpO2 98%   BMI 38.39 kg/m   Vitals:   12/08/22 1525  BP: (!) 148/84  Pulse: 84  Height: 6\' 1"  (1.854 m)  Weight: 291 lb (132 kg)  SpO2: 98%  BMI (Calculated): 38.4    Physical Exam Nursing note reviewed.  Constitutional:      Appearance: Normal appearance. He is normal weight.  HENT:     Head: Normocephalic and atraumatic.     Nose: Nose normal.     Mouth/Throat:     Mouth: Mucous membranes are moist.     Pharynx: Oropharynx is clear.  Eyes:     Extraocular Movements: Extraocular movements intact.     Conjunctiva/sclera: Conjunctivae normal.      Pupils: Pupils are equal, round, and reactive to light.  Cardiovascular:     Rate and Rhythm: Normal rate and regular rhythm.     Pulses: Normal pulses.     Heart sounds: Normal heart sounds.  Pulmonary:     Effort: Pulmonary effort is normal.     Breath sounds: Normal breath sounds.  Abdominal:     General: Abdomen is flat. Bowel sounds are normal.     Palpations: Abdomen is soft.  Musculoskeletal:        General: Normal range of motion.     Cervical back: Normal range of motion.  Skin:    General: Skin is warm and dry.  Neurological:     General: No focal deficit present.     Mental Status: He is alert and oriented to person, place, and time.  Psychiatric:        Mood and Affect: Mood normal.        Behavior: Behavior normal.        Thought Content: Thought content normal.        Judgment: Judgment normal.      No results found for any visits on 12/08/22.  Recent Results (from the past 2160 hour(s))  Urinalysis, Complete     Status: Abnormal   Collection Time: 09/16/22  3:15 PM  Result Value Ref Range   Specific Gravity, UA >1.030 (H) 1.005 - 1.030   pH, UA 5.5 5.0 - 7.5   Color, UA Yellow Yellow   Appearance Ur Clear Clear   Leukocytes,UA Negative Negative   Protein,UA Trace (A) Negative/Trace   Glucose, UA Negative Negative   Ketones, UA Negative Negative   RBC, UA 1+ (A) Negative   Bilirubin, UA Negative Negative   Urobilinogen, Ur 2.0 (H) 0.2 - 1.0 mg/dL   Nitrite, UA Negative Negative   Microscopic Examination See below:   Microscopic Examination     Status: Abnormal   Collection Time: 09/16/22  3:15 PM   Urine  Result Value Ref Range   WBC, UA 0-5 0 - 5 /hpf   RBC, Urine 11-30 (A) 0 - 2 /hpf   Epithelial Cells (non renal) 0-10 0 - 10 /hpf   Casts Present (A) None seen /lpf   Cast Type Granular casts (A) N/A   Mucus, UA Present (A) Not Estab.   Bacteria, UA Moderate (A) None seen/Few  CULTURE, URINE COMPREHENSIVE     Status: None   Collection Time:  09/16/22  3:46 PM   Specimen: Urine   UR  Result Value Ref Range   Urine Culture, Comprehensive Final report    Organism ID, Bacteria Comment     Comment: Mixed urogenital flora 1,000 Colonies/mL   Urinalysis, Complete     Status: Abnormal   Collection  Time: 10/01/22  3:22 PM  Result Value Ref Range   Specific Gravity, UA >1.030 (H) 1.005 - 1.030   pH, UA 5.5 5.0 - 7.5   Color, UA Yellow Yellow   Appearance Ur Clear Clear   Leukocytes,UA Negative Negative   Protein,UA Negative Negative/Trace   Glucose, UA Negative Negative   Ketones, UA Negative Negative   RBC, UA Trace (A) Negative   Bilirubin, UA Negative Negative   Urobilinogen, Ur 2.0 (H) 0.2 - 1.0 mg/dL   Nitrite, UA Negative Negative   Microscopic Examination See below:   Microscopic Examination     Status: Abnormal   Collection Time: 10/01/22  3:22 PM   Urine  Result Value Ref Range   WBC, UA 0-5 0 - 5 /hpf   RBC, Urine 11-30 (A) 0 - 2 /hpf   Epithelial Cells (non renal) 0-10 0 - 10 /hpf   Mucus, UA Present (A) Not Estab.   Bacteria, UA Few None seen/Few  PSA, total and free     Status: None   Collection Time: 10/01/22  3:34 PM  Result Value Ref Range   Prostate Specific Ag, Serum 2.0 0.0 - 4.0 ng/mL    Comment: Roche ECLIA methodology. According to the American Urological Association, Serum PSA should decrease and remain at undetectable levels after radical prostatectomy. The AUA defines biochemical recurrence as an initial PSA value 0.2 ng/mL or greater followed by a subsequent confirmatory PSA value 0.2 ng/mL or greater. Values obtained with different assay methods or kits cannot be used interchangeably. Results cannot be interpreted as absolute evidence of the presence or absence of malignant disease.    PSA, Free 0.70 N/A ng/mL    Comment: Roche ECLIA methodology.   PSA, Free Pct 35.0 %    Comment: The table below lists the probability of prostate cancer for men with non-suspicious DRE results and total  PSA between 4 and 10 ng/mL, by patient age Damaris Schooner, JAMA 1998, 295:6213).                   % Free PSA       50-64 yr        65-75 yr                   0.00-10.00%        56%             55%                  10.01-15.00%        24%             35%                  15.01-20.00%        17%             23%                  20.01-25.00%        10%             20%                       >25.00%         5%              9% Please note:  Catalona et al did not make specific  recommendations regarding the use of               percent free PSA for any other population               of men.   Bladder Scan (Post Void Residual) in office     Status: None   Collection Time: 10/01/22  3:38 PM  Result Value Ref Range   Scan Result 47ml       Assessment & Plan:  Return for fasting lab work.  New referral sent to ENT.   Problem List Items Addressed This Visit       Cardiovascular and Mediastinum   Essential (primary) hypertension - Primary     Other   Current tobacco use   Prediabetes   Relevant Orders   CMP14+EGFR   Hemoglobin A1c   Other Visit Diagnoses     Mixed hyperlipidemia       Relevant Orders   Lipid Profile   Thyroid disorder screening       Relevant Orders   TSH   Other chronic sinusitis       Relevant Orders   Ambulatory referral to ENT       Return in about 3 months (around 03/10/2023).   Total time spent: 25 minutes  [employer], NP  12/08/2022   This document may have been prepared by Dragon Voice Recognition software and as such may include unintentional dictation errors.

## 2022-12-28 ENCOUNTER — Other Ambulatory Visit: Payer: Self-pay

## 2022-12-28 DIAGNOSIS — E349 Endocrine disorder, unspecified: Secondary | ICD-10-CM

## 2022-12-28 NOTE — Telephone Encounter (Signed)
Pt left message asking for refills on testosterone.

## 2022-12-29 MED ORDER — BD DISP NEEDLES 21G X 1-1/2" MISC
1.0000 mg | 0 refills | Status: AC
Start: 2022-12-29 — End: ?

## 2022-12-29 MED ORDER — TESTOSTERONE CYPIONATE 200 MG/ML IM SOLN: 200.0000 mg | INTRAMUSCULAR | 0 refills | Status: DC

## 2022-12-29 MED ORDER — SYRINGE 2-3 ML 3 ML MISC: 1.0000 mg | 3 refills | Status: AC

## 2022-12-29 MED ORDER — BD DISP NEEDLES 18G X 1-1/2" MISC
1.0000 mg | 0 refills | Status: AC
Start: 2022-12-29 — End: ?

## 2023-01-12 ENCOUNTER — Other Ambulatory Visit: Payer: No Typology Code available for payment source

## 2023-01-12 ENCOUNTER — Other Ambulatory Visit: Payer: Self-pay

## 2023-01-12 DIAGNOSIS — R7303 Prediabetes: Secondary | ICD-10-CM

## 2023-01-12 DIAGNOSIS — Z1329 Encounter for screening for other suspected endocrine disorder: Secondary | ICD-10-CM

## 2023-01-12 DIAGNOSIS — E782 Mixed hyperlipidemia: Secondary | ICD-10-CM

## 2023-01-13 LAB — CMP14+EGFR
ALT: 20 [IU]/L (ref 0–44)
AST: 14 [IU]/L (ref 0–40)
Albumin: 4 g/dL (ref 3.9–4.9)
Alkaline Phosphatase: 91 [IU]/L (ref 44–121)
BUN/Creatinine Ratio: 13 (ref 10–24)
BUN: 14 mg/dL (ref 8–27)
Bilirubin Total: 0.2 mg/dL (ref 0.0–1.2)
CO2: 27 mmol/L (ref 20–29)
Calcium: 9.1 mg/dL (ref 8.6–10.2)
Chloride: 105 mmol/L (ref 96–106)
Creatinine, Ser: 1.11 mg/dL (ref 0.76–1.27)
Globulin, Total: 2.7 g/dL (ref 1.5–4.5)
Glucose: 98 mg/dL (ref 70–99)
Potassium: 4.3 mmol/L (ref 3.5–5.2)
Sodium: 142 mmol/L (ref 134–144)
Total Protein: 6.7 g/dL (ref 6.0–8.5)
eGFR: 75 mL/min/{1.73_m2} (ref >59)

## 2023-01-13 LAB — HEMOGLOBIN A1C
Est. average glucose Bld gHb Est-mCnc: 134 mg/dL
Hgb A1c MFr Bld: 6.3 % — ABNORMAL HIGH (ref 4.8–5.6)

## 2023-01-13 LAB — LIPID PANEL
Chol/HDL Ratio: 3.3 {ratio} (ref 0.0–5.0)
Cholesterol, Total: 136 mg/dL (ref 100–199)
HDL: 41 mg/dL (ref >39)
LDL Chol Calc (NIH): 72 mg/dL (ref 0–99)
Triglycerides: 127 mg/dL (ref 0–149)
VLDL Cholesterol Cal: 23 mg/dL (ref 5–40)

## 2023-01-13 LAB — TSH: TSH: 0.986 u[IU]/mL (ref 0.450–4.500)

## 2023-02-18 ENCOUNTER — Other Ambulatory Visit: Payer: Self-pay

## 2023-02-18 MED ORDER — FUROSEMIDE 40 MG PO TABS: 40.0000 mg | ORAL_TABLET | Freq: Every day | ORAL | 1 refills | Status: DC

## 2023-02-18 MED ORDER — POTASSIUM CHLORIDE ER 20 MEQ PO TBCR: 1.0000 | EXTENDED_RELEASE_TABLET | Freq: Every day | ORAL | 1 refills | Status: DC

## 2023-03-12 ENCOUNTER — Encounter: Payer: Self-pay | Admitting: Cardiology

## 2023-03-12 ENCOUNTER — Ambulatory Visit (INDEPENDENT_AMBULATORY_CARE_PROVIDER_SITE_OTHER): Payer: PRIVATE HEALTH INSURANCE | Admitting: Cardiology

## 2023-03-12 VITALS — BP 118/82 | HR 79 | Ht 73.0 in | Wt 282.0 lb

## 2023-03-12 DIAGNOSIS — R7303 Prediabetes: Secondary | ICD-10-CM

## 2023-03-12 DIAGNOSIS — I10 Essential (primary) hypertension: Secondary | ICD-10-CM | POA: Diagnosis not present

## 2023-03-12 DIAGNOSIS — E782 Mixed hyperlipidemia: Secondary | ICD-10-CM

## 2023-03-12 DIAGNOSIS — Z1329 Encounter for screening for other suspected endocrine disorder: Secondary | ICD-10-CM

## 2023-03-12 DIAGNOSIS — Z720 Tobacco use: Secondary | ICD-10-CM

## 2023-03-12 NOTE — Progress Notes (Signed)
Established Patient Office Visit  Subjective:  Patient ID: Sean Sosa, male    DOB: 20-May-1960  Age: 64 y.o. MRN: [id number]  Chief Complaint  Patient presents with   Follow-up    3 months follow up    Patient in office for 3 month follow up. Had blood work done 12/2022. Discussed results. Hgb A1c elevated. Patient states he has made dietary changes. Weight down 9 lbs since October 2024 visit. Will recheck lab work prior to next visit.  Scheduled for sinus surgery at [facility] next month.     No other concerns at this time.   Past Medical History:  Diagnosis Date   History of kidney stones    Hypercholesteremia    Nephrolithiasis    Sleep apnea    Patient reports sleep study was neg.    Past Surgical History:  Procedure Laterality Date   CYSTOSCOPY W/ RETROGRADES Left 10/12/2018   Procedure: CYSTOSCOPY WITH RETROGRADE PYELOGRAM;  Surgeon: Riki Altes, MD;  Location: [facility];  Service: Urology;  Laterality: Left;   CYSTOSCOPY/URETEROSCOPY/HOLMIUM LASER/STENT PLACEMENT Left 10/12/2018   Procedure: CYSTOSCOPY/URETEROSCOPY/STENT PLACEMENT;  Surgeon: Riki Altes, MD;  Location: [facility];  Service: Urology;  Laterality: Left;   CYSTOSCOPY/URETEROSCOPY/HOLMIUM LASER/STENT PLACEMENT Left 11/08/2018   Procedure: CYSTOSCOPY/URETEROSCOPY/HOLMIUM LASER/STENT PLACEMENT;  Surgeon: Riki Altes, MD;  Location: [facility];  Service: Urology;  Laterality: Left;   EXTRACORPOREAL SHOCK WAVE LITHOTRIPSY Left 10/06/2018   Procedure: EXTRACORPOREAL SHOCK WAVE LITHOTRIPSY (ESWL);  Surgeon: Riki Altes, MD;  Location: [facility];  Service: Urology;  Laterality: Left;   HAND SURGERY Left 1999   circular saw cut tendons and ligaments   HERNIA REPAIR  1970's    Social History   Socioeconomic History   Marital status: Married    Spouse name: [name]   Number of children: Not on file   Years of education: Not on file   Highest education level: Not on file  Occupational History    Occupation: supervisor  Tobacco Use   Smoking status: Every Day    Current packs/day: 1.50    Types: Cigarettes   Smokeless tobacco: Never  Vaping Use   Vaping status: Never Used  Substance and Sexual Activity   Alcohol use: No   Drug use: Never   Sexual activity: Not on file  Other Topics Concern   Not on file  Social History Narrative   Not on file   Social Drivers of Health   Financial Resource Strain: Not on file  Food Insecurity: Not on file  Transportation Needs: Not on file  Physical Activity: Not on file  Stress: Not on file  Social Connections: Not on file  Intimate Partner Violence: Not on file    Family History  Problem Relation Age of Onset   Prostate cancer Father    Hematuria Father     No Known Allergies  Outpatient Medications Prior to Visit  Medication Sig   aspirin EC 81 MG tablet Take 81 mg by mouth every evening.    furosemide (LASIX) 40 MG tablet Take 1 tablet (40 mg total) by mouth daily.   NEEDLE, DISP, 18 G (BD DISP NEEDLES) 18G X 1-1/2" MISC 1 mg by Does not apply route every 14 (fourteen) days.   NEEDLE, DISP, 21 G (BD DISP NEEDLES) 21G X 1-1/2" MISC 1 mg by Does not apply route every 14 (fourteen) days.   Potassium Chloride ER 20 MEQ TBCR Take 1 tablet (20 mEq total) by mouth daily.  rosuvastatin (CRESTOR) 5 MG tablet Take 5 mg by mouth daily.   Syringe, Disposable, (2-3CC SYRINGE) 3 ML MISC 1 mg by Does not apply route every 14 (fourteen) days.   tadalafil (CIALIS) 20 MG tablet Take 1 tablet (20 mg total) by mouth daily as needed for erectile dysfunction.   tadalafil (CIALIS) 5 MG tablet Take 1 tablet (5 mg total) by mouth daily as needed for erectile dysfunction.   tamsulosin (FLOMAX) 0.4 MG CAPS capsule Take 1 capsule (0.4 mg total) by mouth daily.   testosterone cypionate (DEPOTESTOSTERONE CYPIONATE) 200 MG/ML injection Inject 1 mL (200 mg total) into the muscle every 14 (fourteen) days.   No facility-administered medications prior to  visit.    Review of Systems  Constitutional: Negative.   HENT: Negative.    Eyes: Negative.   Respiratory: Negative.  Negative for shortness of breath.   Cardiovascular: Negative.  Negative for chest pain.  Gastrointestinal: Negative.  Negative for abdominal pain, constipation and diarrhea.  Genitourinary: Negative.   Musculoskeletal:  Negative for joint pain and myalgias.  Skin: Negative.   Neurological: Negative.  Negative for dizziness and headaches.  Endo/Heme/Allergies: Negative.   All other systems reviewed and are negative.      Objective:   BP 118/82 (BP Location: Right Arm, Patient Position: Sitting, Cuff Size: Large)   Pulse 79   Ht 6\' 1"  (1.854 m)   Wt 282 lb (127.9 kg)   SpO2 92%   BMI 37.21 kg/m   Vitals:   03/12/23 1521 03/12/23 1536  BP: (!) 160/78 118/82  Pulse: 79   Height: 6\' 1"  (1.854 m)   Weight: 282 lb (127.9 kg)   SpO2: 92%   BMI (Calculated): 37.21     Physical Exam Nursing note reviewed.  Constitutional:      Appearance: Normal appearance. He is normal weight.  HENT:     Head: Normocephalic and atraumatic.     Nose: Nose normal.     Mouth/Throat:     Mouth: Mucous membranes are moist.     Pharynx: Oropharynx is clear.  Eyes:     Extraocular Movements: Extraocular movements intact.     Conjunctiva/sclera: Conjunctivae normal.     Pupils: Pupils are equal, round, and reactive to light.  Cardiovascular:     Rate and Rhythm: Normal rate and regular rhythm.     Pulses: Normal pulses.     Heart sounds: Normal heart sounds.  Pulmonary:     Effort: Pulmonary effort is normal.     Breath sounds: Normal breath sounds.  Abdominal:     General: Abdomen is flat. Bowel sounds are normal.     Palpations: Abdomen is soft.  Musculoskeletal:        General: Normal range of motion.     Cervical back: Normal range of motion.  Skin:    General: Skin is warm and dry.  Neurological:     General: No focal deficit present.     Mental Status: He is  alert and oriented to person, place, and time.  Psychiatric:        Mood and Affect: Mood normal.        Behavior: Behavior normal.        Thought Content: Thought content normal.        Judgment: Judgment normal.      No results found for any visits on 03/12/23.  Recent Results (from the past 2160 hours)  TSH     Status: None   Collection  Time: 01/12/23  3:37 PM  Result Value Ref Range   TSH 0.986 0.450 - 4.500 uIU/mL  Hemoglobin A1c     Status: Abnormal   Collection Time: 01/12/23  3:37 PM  Result Value Ref Range   Hgb A1c MFr Bld 6.3 (H) 4.8 - 5.6 %    Comment:          Prediabetes: 5.7 - 6.4          Diabetes: >6.4          Glycemic control for adults with diabetes: <7.0    Est. average glucose Bld gHb Est-mCnc 134 mg/dL  Lipid Profile     Status: None   Collection Time: 01/12/23  3:37 PM  Result Value Ref Range   Cholesterol, Total 136 100 - 199 mg/dL   Triglycerides 161 0 - 149 mg/dL   HDL 41 >09 mg/dL   VLDL Cholesterol Cal 23 5 - 40 mg/dL   LDL Chol Calc (NIH) 72 0 - 99 mg/dL   Chol/HDL Ratio 3.3 0.0 - 5.0 ratio    Comment:                                   T. Chol/HDL Ratio                                             Men  Women                               1/2 Avg.Risk  3.4    3.3                                   Avg.Risk  5.0    4.4                                2X Avg.Risk  9.6    7.1                                3X Avg.Risk 23.4   11.0   CMP14+EGFR     Status: None   Collection Time: 01/12/23  3:37 PM  Result Value Ref Range   Glucose 98 70 - 99 mg/dL   BUN 14 8 - 27 mg/dL   Creatinine, Ser 6.04 0.76 - 1.27 mg/dL   eGFR 75 >54 UJ/WJX/9.14   BUN/Creatinine Ratio 13 10 - 24   Sodium 142 134 - 144 mmol/L   Potassium 4.3 3.5 - 5.2 mmol/L   Chloride 105 96 - 106 mmol/L   CO2 27 20 - 29 mmol/L   Calcium 9.1 8.6 - 10.2 mg/dL   Total Protein 6.7 6.0 - 8.5 g/dL   Albumin 4.0 3.9 - 4.9 g/dL   Globulin, Total 2.7 1.5 - 4.5 g/dL   Bilirubin Total 0.2 0.0  - 1.2 mg/dL   Alkaline Phosphatase 91 44 - 121 IU/L   AST 14 0 - 40 IU/L   ALT 20 0 - 44 IU/L      Assessment & Plan:  Continue dietary changes.  Fasting lab work prior to next visit.   Problem List Items Addressed This Visit       Cardiovascular and Mediastinum   Essential (primary) hypertension - Primary     Other   Prediabetes    Return in about 3 months (around 06/10/2023) for with fasting labs prior.   Total time spent: 25 minutes  [employer], NP  03/12/2023   This document may have been prepared by Dragon Voice Recognition software and as such may include unintentional dictation errors.

## 2023-03-14 ENCOUNTER — Other Ambulatory Visit: Payer: Self-pay | Admitting: Cardiology

## 2023-04-05 ENCOUNTER — Other Ambulatory Visit: Payer: No Typology Code available for payment source

## 2023-04-05 DIAGNOSIS — E291 Testicular hypofunction: Secondary | ICD-10-CM

## 2023-04-05 DIAGNOSIS — R3129 Other microscopic hematuria: Secondary | ICD-10-CM

## 2023-04-05 DIAGNOSIS — R972 Elevated prostate specific antigen [PSA]: Secondary | ICD-10-CM

## 2023-04-06 LAB — HEMOGLOBIN AND HEMATOCRIT, BLOOD
Hematocrit: 46.9 % (ref 37.5–51.0)
Hemoglobin: 15.2 g/dL (ref 13.0–17.7)

## 2023-04-06 LAB — URINALYSIS, COMPLETE
Bilirubin, UA: NEGATIVE
Glucose, UA: NEGATIVE
Ketones, UA: NEGATIVE
Leukocytes,UA: NEGATIVE
Nitrite, UA: NEGATIVE
Protein,UA: NEGATIVE
Specific Gravity, UA: >1.03 — ABNORMAL HIGH (ref 1.005–1.030)
Urobilinogen, Ur: 1 mg/dL (ref 0.2–1.0)
pH, UA: 5.5 (ref 5.0–7.5)

## 2023-04-06 LAB — MICROSCOPIC EXAMINATION

## 2023-04-06 LAB — PSA: Prostate Specific Ag, Serum: 2.2 ng/mL (ref 0.0–4.0)

## 2023-04-07 NOTE — Progress Notes (Deleted)
 04/07/23 2:03 PM   Sean Sosa Sean Sosa [birth year] [id number]  Referring provider:  Marisue Ivan, NP [address] [place],  [place] [address]  Urological history  1. Nephrolithiasis -Stone composition 100% calcium oxalate monohydrate -Spontaneous passage 7 mm right UVJ stone 2016 -Left ESWL 10/06/2018 -Underwent left stent placement with Dr. Lonna Cobb on 10/12/2018 -Underwent left URS with Dr. Lonna Cobb on 11/08/2018 -CTU 2023 - punctate nonobstructive left lower pole stones   2. BPH with LU TS -PSA (04/05/2023) 2.2 -tamsulosin 0.4 mg daily   3. ED -contributing factors of age, smoker, sleep apnea, BPH, HTN and HLD -failed PDE5i's   4. High risk hematuria -smoker -CTU 2023 - NED -cysto 2023 - prominent BPH with hypervascularity   5. Testosterone deficiency  -Contributing factors of age, sleep apnea, - Managed on TRT; testosterone cypionate-200 mg every 2 weeks to  - Hemoglobin/hematocrit (03/2023) 15.2/46.9 - Testosterone (08/2022) 674  No chief complaint on file.   HPI: Sean Sosa is a 63 y.o.male who presents today for follow up.    Previous records reviewed.   At his visit on 09/19/2022, his PSA is increased from 1.0-3.2 over the last year and his UA has microscopic hematuria today.  He did state that he has been battling a GI infection over the last several weeks with copious amounts of diarrhea.  He states he is finally getting over this illness and is back to normal.  I PSS 10/1.  Patient denies any modifying or aggravating factors.  Patient denies any recent UTI's, gross hematuria, dysuria or suprapubic/flank pain.  Patient denies any fevers, chills, nausea or vomiting.  UA yellow clear, specific gravity greater than 1.030, 1+ heme, pH 5.5, trace protein, 2 urobilinogen, 0-5 WBCs, 11-30 RBCs, granular casts present, 0-10 epithelial cells, mucus threads present and moderate bacteria.  His urine culture was negative for infection.  At his visit on  10/01/2022, he feels fine today.  Patient denies any modifying or aggravating factors.  Patient denies any recent UTI's, gross hematuria, dysuria or suprapubic/flank pain.  Patient denies any fevers, chills, nausea or vomiting.  UA yellow clear, specific gravity greater than 1.030, trace blood, pH 5.5, urobilinogen is 2, 0-5 WBCs, 11-30 RBCs, 0-10 epithelial cells, mucus threads are present and a few bacteria.  PVR 47 mL.  He was instructed to continue the testosterone cypionate 200 mg/mL, 1 cc every 14 days.  Continue sildenafil.  PSA was 2.0.  Repeat UA w/ micro heme.       PMH: Past Medical History:  Diagnosis Date   History of kidney stones    Hypercholesteremia    Nephrolithiasis    Sleep apnea    Patient reports sleep study was neg.    Surgical History: Past Surgical History:  Procedure Laterality Date   CYSTOSCOPY W/ RETROGRADES Left 10/12/2018   Procedure: CYSTOSCOPY WITH RETROGRADE PYELOGRAM;  Surgeon: Riki Altes, MD;  Location: [facility];  Service: Urology;  Laterality: Left;   CYSTOSCOPY/URETEROSCOPY/HOLMIUM LASER/STENT PLACEMENT Left 10/12/2018   Procedure: CYSTOSCOPY/URETEROSCOPY/STENT PLACEMENT;  Surgeon: Riki Altes, MD;  Location: [facility];  Service: Urology;  Laterality: Left;   CYSTOSCOPY/URETEROSCOPY/HOLMIUM LASER/STENT PLACEMENT Left 11/08/2018   Procedure: CYSTOSCOPY/URETEROSCOPY/HOLMIUM LASER/STENT PLACEMENT;  Surgeon: Riki Altes, MD;  Location: [facility];  Service: Urology;  Laterality: Left;   EXTRACORPOREAL SHOCK WAVE LITHOTRIPSY Left 10/06/2018   Procedure: EXTRACORPOREAL SHOCK WAVE LITHOTRIPSY (ESWL);  Surgeon: Riki Altes, MD;  Location: [facility];  Service: Urology;  Laterality: Left;   HAND SURGERY Left 1999  circular saw cut tendons and ligaments   HERNIA REPAIR  1970's    Home Medications:  Allergies as of 04/08/2023   No Known Allergies      Medication List        Accurate as of April 07, 2023  2:03 PM. If you have any  questions, ask your nurse or doctor.          2-3CC SYRINGE 3 ML Misc 1 mg by Does not apply route every 14 (fourteen) days.   aspirin EC 81 MG tablet Take 81 mg by mouth every evening.   BD Disp Needles 18G X 1-1/2" Misc Generic drug: NEEDLE (DISP) 18 G 1 mg by Does not apply route every 14 (fourteen) days.   BD Disp Needles 21G X 1-1/2" Misc Generic drug: NEEDLE (DISP) 21 G 1 mg by Does not apply route every 14 (fourteen) days.   furosemide 40 MG tablet Commonly known as: LASIX Take 1 tablet (40 mg total) by mouth daily.   Potassium Chloride ER 20 MEQ Tbcr TAKE 1 TABLET BY MOUTH EVERY DAY   rosuvastatin 5 MG tablet Commonly known as: CRESTOR Take 5 mg by mouth daily.   tadalafil 20 MG tablet Commonly known as: CIALIS Take 1 tablet (20 mg total) by mouth daily as needed for erectile dysfunction.   tadalafil 5 MG tablet Commonly known as: CIALIS Take 1 tablet (5 mg total) by mouth daily as needed for erectile dysfunction.   tamsulosin 0.4 MG Caps capsule Commonly known as: FLOMAX Take 1 capsule (0.4 mg total) by mouth daily.   testosterone cypionate 200 MG/ML injection Commonly known as: DEPOTESTOSTERONE CYPIONATE Inject 1 mL (200 mg total) into the muscle every 14 (fourteen) days.        Allergies:  No Known Allergies  Family History: Family History  Problem Relation Age of Onset   Prostate cancer Father    Hematuria Father     Social History:  reports that he has been smoking cigarettes. He has never used smokeless tobacco. He reports that he does not drink alcohol and does not use drugs.   Physical Exam: There were no vitals taken for this visit.  Constitutional:  Well nourished. Alert and oriented, No acute distress. HEENT: [place] AT, moist mucus membranes.  Trachea midline, no masses. Cardiovascular: No clubbing, cyanosis, or edema. Respiratory: Normal respiratory effort, no increased work of breathing. GI: Abdomen is soft, non tender, non  distended, no abdominal masses. Liver and spleen not palpable.  No hernias appreciated.  Stool sample for occult testing is not indicated.   GU: No CVA tenderness.  No bladder fullness or masses.  Patient with circumcised/uncircumcised phallus. ***Foreskin easily retracted***  Urethral meatus is patent.  No penile discharge. No penile lesions or rashes. Scrotum without lesions, cysts, rashes and/or edema.  Testicles are located scrotally bilaterally. No masses are appreciated in the testicles. Left and right epididymis are normal. Rectal: Patient with  normal sphincter tone. Anus and perineum without scarring or rashes. No rectal masses are appreciated. Prostate is approximately *** grams, *** nodules are appreciated. Seminal vesicles are normal. Skin: No rashes, bruises or suspicious lesions. Lymph: No cervical or inguinal adenopathy. Neurologic: Grossly intact, no focal deficits, moving all 4 extremities. Psychiatric: Normal mood and affect.   Laboratory Data: Urinalysis Results for orders placed or performed in visit on 04/05/23  Microscopic Examination   Collection Time: 04/05/23  3:00 PM   Urine  Result Value Ref Range   WBC, UA 0-5 0 -  5 /hpf   RBC, Urine 3-10 (A) 0 - 2 /hpf   Epithelial Cells (non renal) 0-10 0 - 10 /hpf   Bacteria, UA Few None seen/Few  Urinalysis, Complete   Collection Time: 04/05/23  3:00 PM  Result Value Ref Range   Specific Gravity, UA >1.030 (H) 1.005 - 1.030   pH, UA 5.5 5.0 - 7.5   Color, UA Yellow Yellow   Appearance Ur Clear Clear   Leukocytes,UA Negative Negative   Protein,UA Negative Negative/Trace   Glucose, UA Negative Negative   Ketones, UA Negative Negative   RBC, UA Trace (A) Negative   Bilirubin, UA Negative Negative   Urobilinogen, Ur 1.0 0.2 - 1.0 mg/dL   Nitrite, UA Negative Negative   Microscopic Examination See below:   PSA   Collection Time: 04/05/23  3:11 PM  Result Value Ref Range   Prostate Specific Ag, Serum 2.2 0.0 - 4.0 ng/mL   Hemoglobin and hematocrit, blood   Collection Time: 04/05/23  3:11 PM  Result Value Ref Range   Hemoglobin 15.2 13.0 - 17.7 g/dL   Hematocrit 38.7 56.4 - 51.0 %  I have reviewed the labs.    Assessment & Plan:    1.  Testosterone deficiency -Testosterone level pending -Hemoglobin and hematocrit are normal  -continue testosterone cypionate 200 mg/mL, 1 cc every 14 days   2. BPH with LUTS -PSA stable -DRE *** -most bothersome symptoms are mild urgency  -continue conservative management, avoiding bladder irritants and timed voiding's -Tamsulosin 0.4 mg daily -may consider adding finasteride 5 mg daily if PSA does not decrease at an acceptable level or prostate mri   3. ED -Sildenafil did not provide good response -tadalafil 20 mg on-demand dosing - losing efficacy  4. High risk hematuria -smoker -Work-up in 2023 prostatic hypervascularity and nephrolithiasis -No reports of gross heme -UA w/ micro heme  -will consider adding finasteride pending PSA results to reduce hypervascularity    No follow-ups on file.  Cloretta Ned   [facility] [address], [address] [place], [place] [address] [phone]

## 2023-04-08 ENCOUNTER — Ambulatory Visit (INDEPENDENT_AMBULATORY_CARE_PROVIDER_SITE_OTHER): Payer: PRIVATE HEALTH INSURANCE | Admitting: Urology

## 2023-04-08 ENCOUNTER — Ambulatory Visit: Payer: Self-pay | Admitting: Urology

## 2023-04-08 ENCOUNTER — Encounter: Payer: Self-pay | Admitting: Urology

## 2023-04-08 VITALS — BP 147/82 | HR 82

## 2023-04-08 DIAGNOSIS — E291 Testicular hypofunction: Secondary | ICD-10-CM | POA: Diagnosis not present

## 2023-04-08 DIAGNOSIS — R3129 Other microscopic hematuria: Secondary | ICD-10-CM

## 2023-04-08 DIAGNOSIS — E349 Endocrine disorder, unspecified: Secondary | ICD-10-CM

## 2023-04-08 DIAGNOSIS — N529 Male erectile dysfunction, unspecified: Secondary | ICD-10-CM

## 2023-04-08 DIAGNOSIS — N138 Other obstructive and reflux uropathy: Secondary | ICD-10-CM

## 2023-04-08 DIAGNOSIS — N401 Enlarged prostate with lower urinary tract symptoms: Secondary | ICD-10-CM | POA: Diagnosis not present

## 2023-04-08 MED ORDER — TESTOSTERONE CYPIONATE 200 MG/ML IM SOLN
200.0000 mg | INTRAMUSCULAR | 0 refills | Status: DC
Start: 2023-04-08 — End: 2023-07-01

## 2023-04-08 NOTE — Progress Notes (Signed)
04/08/23 2:50 PM   Sean Sosa [name] 03-Feb-1961 [id number]  Referring provider:  Marisue Ivan, NP [address] [place],  [place] [address]  Urological history  1. Nephrolithiasis -Stone composition 100% calcium oxalate monohydrate -Spontaneous passage 7 mm right UVJ stone 2016 -Left ESWL 10/06/2018 -Underwent left stent placement with Dr. Lonna Cobb on 10/12/2018 -Underwent left URS with Dr. Lonna Cobb on 11/08/2018 -CTU 2023 - punctate nonobstructive left lower pole stones   2. BPH with LU TS -PSA (04/05/2023) 2.2 -tamsulosin 0.4 mg daily   3. ED -contributing factors of age, smoker, sleep apnea, BPH, HTN and HLD -failed PDE5i's   4. High risk hematuria -smoker -CTU 2023 - NED -cysto 2023 - prominent BPH with hypervascularity   5. Testosterone deficiency  -Contributing factors of age, sleep apnea, - Managed on TRT; testosterone cypionate-200 mg every 2 weeks to  - Hemoglobin/hematocrit (03/2023) 15.2/46.9 - Testosterone (08/2022) 674  Chief Complaint  Patient presents with   Follow-up    HPI: Sean Sosa is a 63 y.o.male who presents today for follow up with his wife, [name].   Previous records reviewed.   At his visit on 09/19/2022, his PSA is increased from 1.0-3.2 over the last year and his UA has microscopic hematuria today.  He did state that he has been battling a GI infection over the last several weeks with copious amounts of diarrhea.  He states he is finally getting over this illness and is back to normal.  I PSS 10/1.  Patient denies any modifying or aggravating factors.  Patient denies any recent UTI's, gross hematuria, dysuria or suprapubic/flank pain.  Patient denies any fevers, chills, nausea or vomiting.  UA yellow clear, specific gravity greater than 1.030, 1+ heme, pH 5.5, trace protein, 2 urobilinogen, 0-5 WBCs, 11-30 RBCs, granular casts present, 0-10 epithelial cells, mucus threads present and moderate bacteria.  His urine culture was  negative for infection.  At his visit on 10/01/2022, he feels fine today.  Patient denies any modifying or aggravating factors.  Patient denies any recent UTI's, gross hematuria, dysuria or suprapubic/flank pain.  Patient denies any fevers, chills, nausea or vomiting.  UA yellow clear, specific gravity greater than 1.030, trace blood, pH 5.5, urobilinogen is 2, 0-5 WBCs, 11-30 RBCs, 0-10 epithelial cells, mucus threads are present and a few bacteria.  PVR 47 mL.  He was instructed to continue the testosterone cypionate 200 mg/mL, 1 cc every 14 days.  Continue sildenafil.  PSA was 2.0.  He has been without testosterone cypionate for the last month.  He states he could not get a refill.  Repeat UA w/ micro heme.     I PSS 4/1  He has no complaints.  Patient denies any modifying or aggravating factors.  Patient denies any recent UTI's, gross hematuria, dysuria or suprapubic/flank pain.  Patient denies any fevers, chills, nausea or vomiting.     IPSS     Row Name 04/08/23 1300         International Prostate Symptom Score   How often have you had the sensation of not emptying your bladder? Less than 1 in 5     How often have you had to urinate less than every two hours? Not at All     How often have you found you stopped and started again several times when you urinated? Less than 1 in 5 times     How often have you found it difficult to postpone urination? Not at All  How often have you had a weak urinary stream? Less than 1 in 5 times     How often have you had to strain to start urination? Less than 1 in 5 times     How many times did you typically get up at night to urinate? None     Total IPSS Score 4       Quality of Life due to urinary symptoms   If you were to spend the rest of your life with your urinary condition just the way it is now how would you feel about that? Pleased              Score:  1-7 Mild 8-19 Moderate 20-35 Severe   SHIM 11  He states he has  satisfactory erections with the tadalafil.  He denies any spontaneous erections.  He denies any pain or curvature with erections.   SHIM     Row Name 04/08/23 1359         SHIM: Over the last 6 months:   How do you rate your confidence that you could get and keep an erection? Low     When you had erections with sexual stimulation, how often were your erections hard enough for penetration (entering your partner)? A Few Times (much less than half the time)     During sexual intercourse, how often were you able to maintain your erection after you had penetrated (entered) your partner? A Few Times (much less than half the time)     During sexual intercourse, how difficult was it to maintain your erection to completion of intercourse? Very Difficult     When you attempted sexual intercourse, how often was it satisfactory for you? Sometimes (about half the time)       SHIM Total Score   SHIM 11              Score: 1-7 Severe ED 8-11 Moderate ED 12-16 Mild-Moderate ED 17-21 Mild ED 22-25 No ED   PMH: Past Medical History:  Diagnosis Date   History of kidney stones    Hypercholesteremia    Nephrolithiasis    Sleep apnea    Patient reports sleep study was neg.    Surgical History: Past Surgical History:  Procedure Laterality Date   CYSTOSCOPY W/ RETROGRADES Left 10/12/2018   Procedure: CYSTOSCOPY WITH RETROGRADE PYELOGRAM;  Surgeon: Riki Altes, MD;  Location: [facility];  Service: Urology;  Laterality: Left;   CYSTOSCOPY/URETEROSCOPY/HOLMIUM LASER/STENT PLACEMENT Left 10/12/2018   Procedure: CYSTOSCOPY/URETEROSCOPY/STENT PLACEMENT;  Surgeon: Riki Altes, MD;  Location: [facility];  Service: Urology;  Laterality: Left;   CYSTOSCOPY/URETEROSCOPY/HOLMIUM LASER/STENT PLACEMENT Left 11/08/2018   Procedure: CYSTOSCOPY/URETEROSCOPY/HOLMIUM LASER/STENT PLACEMENT;  Surgeon: Riki Altes, MD;  Location: [facility];  Service: Urology;  Laterality: Left;   EXTRACORPOREAL SHOCK WAVE  LITHOTRIPSY Left 10/06/2018   Procedure: EXTRACORPOREAL SHOCK WAVE LITHOTRIPSY (ESWL);  Surgeon: Riki Altes, MD;  Location: [facility];  Service: Urology;  Laterality: Left;   HAND SURGERY Left 1999   circular saw cut tendons and ligaments   HERNIA REPAIR  1970's    Home Medications:  Allergies as of 04/08/2023   No Known Allergies      Medication List        Accurate as of April 08, 2023  2:50 PM. If you have any questions, ask your nurse or doctor.          2-3CC SYRINGE 3 ML Misc 1 mg by Does not apply  route every 14 (fourteen) days.   aspirin EC 81 MG tablet Take 81 mg by mouth every evening.   B-D 3CC LUER-LOK SYR 23GX1-1/2 23G X 1-1/2" 3 ML Misc Generic drug: SYRINGE-NEEDLE (DISP) 3 ML USE EVERY 14 (FOURTEEN) DAYS.   BD Disp Needles 18G X 1-1/2" Misc Generic drug: NEEDLE (DISP) 18 G 1 mg by Does not apply route every 14 (fourteen) days.   BD Disp Needles 21G X 1-1/2" Misc Generic drug: NEEDLE (DISP) 21 G 1 mg by Does not apply route every 14 (fourteen) days.   furosemide 40 MG tablet Commonly known as: LASIX Take 1 tablet (40 mg total) by mouth daily.   Potassium Chloride ER 20 MEQ Tbcr TAKE 1 TABLET BY MOUTH EVERY DAY   rosuvastatin 5 MG tablet Commonly known as: CRESTOR Take 5 mg by mouth daily.   tadalafil 20 MG tablet Commonly known as: CIALIS Take 1 tablet (20 mg total) by mouth daily as needed for erectile dysfunction.   tadalafil 5 MG tablet Commonly known as: CIALIS Take 1 tablet (5 mg total) by mouth daily as needed for erectile dysfunction.   tamsulosin 0.4 MG Caps capsule Commonly known as: FLOMAX Take 1 capsule (0.4 mg total) by mouth daily.   testosterone cypionate 200 MG/ML injection Commonly known as: DEPOTESTOSTERONE CYPIONATE Inject 1 mL (200 mg total) into the muscle every 14 (fourteen) days.        Allergies:  No Known Allergies  Family History: Family History  Problem Relation Age of Onset   Prostate cancer  Father    Hematuria Father     Social History:  reports that he has been smoking cigarettes. He has never used smokeless tobacco. He reports that he does not drink alcohol and does not use drugs.   Physical Exam: BP (!) 147/82   Pulse 82   Constitutional:  Well nourished. Alert and oriented, No acute distress. HEENT: [place] AT, moist mucus membranes.  Trachea midline, no masses. Cardiovascular: No clubbing, cyanosis, or edema. Respiratory: Normal respiratory effort, no increased work of breathing. Neurologic: Grossly intact, no focal deficits, moving all 4 extremities. Psychiatric: Normal mood and affect.   Laboratory Data: Urinalysis Results for orders placed or performed in visit on 04/05/23  Microscopic Examination   Collection Time: 04/05/23  3:00 PM   Urine  Result Value Ref Range   WBC, UA 0-5 0 - 5 /hpf   RBC, Urine 3-10 (A) 0 - 2 /hpf   Epithelial Cells (non renal) 0-10 0 - 10 /hpf   Bacteria, UA Few None seen/Few  Urinalysis, Complete   Collection Time: 04/05/23  3:00 PM  Result Value Ref Range   Specific Gravity, UA >1.030 (H) 1.005 - 1.030   pH, UA 5.5 5.0 - 7.5   Color, UA Yellow Yellow   Appearance Ur Clear Clear   Leukocytes,UA Negative Negative   Protein,UA Negative Negative/Trace   Glucose, UA Negative Negative   Ketones, UA Negative Negative   RBC, UA Trace (A) Negative   Bilirubin, UA Negative Negative   Urobilinogen, Ur 1.0 0.2 - 1.0 mg/dL   Nitrite, UA Negative Negative   Microscopic Examination See below:   PSA   Collection Time: 04/05/23  3:11 PM  Result Value Ref Range   Prostate Specific Ag, Serum 2.2 0.0 - 4.0 ng/mL  Hemoglobin and hematocrit, blood   Collection Time: 04/05/23  3:11 PM  Result Value Ref Range   Hemoglobin 15.2 13.0 - 17.7 g/dL   Hematocrit 43.3 29.5 -  51.0 %  I have reviewed the labs.    Assessment & Plan:    1.  Testosterone deficiency -Testosterone level was not ordered with recent blood work, but he has been without  his testosterone for 1 month -Hemoglobin and hematocrit are normal  -continue testosterone cypionate 200 mg/mL, 1 cc every 14 days   2. BPH with LUTS -PSA stable -most bothersome symptoms are mild urgency  -continue conservative management, avoiding bladder irritants and timed voiding's -Tamsulosin 0.4 mg daily  3. ED -Sildenafil did not provide good response -tadalafil 20 mg on-demand dosing - losing efficacy  4. High risk hematuria -smoker -Work-up in 2023 prostatic hypervascularity and nephrolithiasis -No reports of gross heme -UA w/ micro heme  -We discussed that there are no clear set guidelines on persistent microscopic hematuria after a negative hematuria work up, but since he is a smoker and it has been two years since his last work up, we could consider a RUS and repeating the cysto and he is in agreement   Return for return for RUS report/cysto wtih Dr. Lonna Cobb for micro heme .  Cloretta Ned   [facility] [address], [address] [place], [place] [address] [phone]

## 2023-04-13 ENCOUNTER — Ambulatory Visit
Admission: RE | Admit: 2023-04-13 | Discharge: 2023-04-13 | Disposition: A | Discharge status: Home / Self Care | Payer: PRIVATE HEALTH INSURANCE | Source: Ambulatory Visit | Attending: Urology | Admitting: Urology

## 2023-04-13 DIAGNOSIS — R3129 Other microscopic hematuria: Secondary | ICD-10-CM | POA: Insufficient documentation

## 2023-05-14 ENCOUNTER — Ambulatory Visit (INDEPENDENT_AMBULATORY_CARE_PROVIDER_SITE_OTHER): Payer: PRIVATE HEALTH INSURANCE | Admitting: Urology

## 2023-05-14 ENCOUNTER — Encounter: Payer: Self-pay | Admitting: Urology

## 2023-05-14 VITALS — BP 159/86 | HR 89 | Ht 73.0 in | Wt 276.0 lb

## 2023-05-14 DIAGNOSIS — R3129 Other microscopic hematuria: Secondary | ICD-10-CM

## 2023-05-14 LAB — MICROSCOPIC EXAMINATION

## 2023-05-14 LAB — URINALYSIS, COMPLETE
Bilirubin, UA: NEGATIVE
Glucose, UA: NEGATIVE
Ketones, UA: NEGATIVE
Leukocytes,UA: NEGATIVE
Nitrite, UA: NEGATIVE
Protein,UA: NEGATIVE
Specific Gravity, UA: >1.03 — ABNORMAL HIGH (ref 1.005–1.030)
Urobilinogen, Ur: 1 mg/dL (ref 0.2–1.0)
pH, UA: 5.5 (ref 5.0–7.5)

## 2023-05-14 NOTE — Progress Notes (Signed)
   05/14/23  CC:  Chief Complaint  Patient presents with   Cysto    HPI: Refer to [facility]'s note of 04/08/2023.  RUS performed last month showed no hydronephrosis or renal mass.  Small, nonobstructing left renal calculi were noted which were also seen on prior CT.  UA today 3-10 RBC  Blood pressure (!) 159/86, pulse 89, height 6\' 1"  (1.854 m), weight 276 lb (125.2 kg). NED. A&Ox3.   No respiratory distress   Abd soft, NT, ND Normal phallus with bilateral descended testicles  Cystoscopy Procedure Note  Patient identification was confirmed, informed consent was obtained, and patient was prepped using Betadine solution.  Lidocaine jelly was administered per urethral meatus.     Pre-Procedure: - Inspection reveals a normal caliber urethral meatus.  Procedure: The flexible cystoscope was introduced without difficulty - No urethral strictures/lesions are present. -Lateral lobe enlargement prostate with marked hypervascularity -Mild elevation bladder neck - Bilateral ureteral orifices identified - Bladder mucosa  reveals no ulcers, tumors, or lesions - No bladder stones -Mild trabeculation  Retroflexion shows no intravesical median lobe; prominent hypervascularity bladder neck   Post-Procedure: - Patient tolerated the procedure well  Assessment/ Plan: No bladder mucosal lesions BPH with hypervascularity Small left renal calculi on RUS Keep scheduled follow-up with Onalee Hua, MD

## 2023-06-17 ENCOUNTER — Ambulatory Visit (INDEPENDENT_AMBULATORY_CARE_PROVIDER_SITE_OTHER): Payer: PRIVATE HEALTH INSURANCE | Admitting: Cardiology

## 2023-06-17 ENCOUNTER — Encounter: Payer: Self-pay | Admitting: Cardiology

## 2023-06-17 VITALS — BP 138/90 | HR 73 | Ht 73.0 in | Wt 279.2 lb

## 2023-06-17 DIAGNOSIS — R7303 Prediabetes: Secondary | ICD-10-CM | POA: Diagnosis not present

## 2023-06-17 DIAGNOSIS — Z1329 Encounter for screening for other suspected endocrine disorder: Secondary | ICD-10-CM

## 2023-06-17 DIAGNOSIS — I10 Essential (primary) hypertension: Secondary | ICD-10-CM | POA: Diagnosis not present

## 2023-06-17 DIAGNOSIS — E782 Mixed hyperlipidemia: Secondary | ICD-10-CM | POA: Insufficient documentation

## 2023-06-17 NOTE — Progress Notes (Signed)
 Established Patient Office Visit  Subjective:  Patient ID: Sean Sosa, male    DOB: 1960/02/28  Age: 63 y.o. MRN: [id number]  Chief Complaint  Patient presents with   Follow-up    3 month lab     Patient in office for 3 month follow up, discuss recent lab results. Patient did not have blood work done, fasting today. Patient doing well, no complaints.  Continue same medications.     No other concerns at this time.   Past Medical History:  Diagnosis Date   History of kidney stones    Hypercholesteremia    Nephrolithiasis    Sleep apnea    Patient reports sleep study was neg.    Past Surgical History:  Procedure Laterality Date   CYSTOSCOPY W/ RETROGRADES Left 10/12/2018   Procedure: CYSTOSCOPY WITH RETROGRADE PYELOGRAM;  Surgeon: Geraline Knapp, MD;  Location: [facility];  Service: Urology;  Laterality: Left;   CYSTOSCOPY/URETEROSCOPY/HOLMIUM LASER/STENT PLACEMENT Left 10/12/2018   Procedure: CYSTOSCOPY/URETEROSCOPY/STENT PLACEMENT;  Surgeon: Geraline Knapp, MD;  Location: [facility];  Service: Urology;  Laterality: Left;   CYSTOSCOPY/URETEROSCOPY/HOLMIUM LASER/STENT PLACEMENT Left 11/08/2018   Procedure: CYSTOSCOPY/URETEROSCOPY/HOLMIUM LASER/STENT PLACEMENT;  Surgeon: Geraline Knapp, MD;  Location: [facility];  Service: Urology;  Laterality: Left;   EXTRACORPOREAL SHOCK WAVE LITHOTRIPSY Left 10/06/2018   Procedure: EXTRACORPOREAL SHOCK WAVE LITHOTRIPSY (ESWL);  Surgeon: Geraline Knapp, MD;  Location: [facility];  Service: Urology;  Laterality: Left;   HAND SURGERY Left 1999   circular saw cut tendons and ligaments   HERNIA REPAIR  1970's    Social History   Socioeconomic History   Marital status: Married    Spouse name: [name]   Number of children: Not on file   Years of education: Not on file   Highest education level: Not on file  Occupational History   Occupation: supervisor  Tobacco Use   Smoking status: Every Day    Current packs/day: 1.50    Types:  Cigarettes   Smokeless tobacco: Never  Vaping Use   Vaping status: Never Used  Substance and Sexual Activity   Alcohol use: No   Drug use: Never   Sexual activity: Not on file  Other Topics Concern   Not on file  Social History Narrative   Not on file   Social Drivers of Health   Financial Resource Strain: Not on file  Food Insecurity: Not on file  Transportation Needs: Not on file  Physical Activity: Not on file  Stress: Not on file  Social Connections: Not on file  Intimate Partner Violence: Not on file    Family History  Problem Relation Age of Onset   Prostate cancer Father    Hematuria Father     No Known Allergies  Outpatient Medications Prior to Visit  Medication Sig   aspirin EC 81 MG tablet Take 81 mg by mouth every evening.    B-D 3CC LUER-LOK SYR 23GX1-1/2 23G X 1-1/2" 3 ML MISC USE EVERY 14 (FOURTEEN) DAYS.   furosemide  (LASIX ) 40 MG tablet Take 1 tablet (40 mg total) by mouth daily.   NEEDLE, DISP, 18 G (BD DISP NEEDLES) 18G X 1-1/2" MISC 1 mg by Does not apply route every 14 (fourteen) days.   NEEDLE, DISP, 21 G (BD DISP NEEDLES) 21G X 1-1/2" MISC 1 mg by Does not apply route every 14 (fourteen) days.   Potassium Chloride  ER 20 MEQ TBCR TAKE 1 TABLET BY MOUTH EVERY DAY   rosuvastatin (CRESTOR) 5  MG tablet Take 5 mg by mouth daily.   Syringe, Disposable, (2-3CC SYRINGE) 3 ML MISC 1 mg by Does not apply route every 14 (fourteen) days.   tadalafil  (CIALIS ) 20 MG tablet Take 1 tablet (20 mg total) by mouth daily as needed for erectile dysfunction.   tadalafil  (CIALIS ) 5 MG tablet Take 1 tablet (5 mg total) by mouth daily as needed for erectile dysfunction.   tamsulosin  (FLOMAX ) 0.4 MG CAPS capsule Take 1 capsule (0.4 mg total) by mouth daily.   testosterone  cypionate (DEPOTESTOSTERONE CYPIONATE) 200 MG/ML injection Inject 1 mL (200 mg total) into the muscle every 14 (fourteen) days.   No facility-administered medications prior to visit.    Review of Systems   Constitutional: Negative.   HENT: Negative.    Eyes: Negative.   Respiratory: Negative.  Negative for shortness of breath.   Cardiovascular: Negative.  Negative for chest pain.  Gastrointestinal: Negative.  Negative for abdominal pain, constipation and diarrhea.  Genitourinary: Negative.   Musculoskeletal:  Negative for joint pain and myalgias.  Skin: Negative.   Neurological: Negative.  Negative for dizziness and headaches.  Endo/Heme/Allergies: Negative.   All other systems reviewed and are negative.      Objective:   BP (!) 138/90   Pulse 73   Ht 6\' 1"  (1.854 m)   Wt 279 lb 3.2 oz (126.6 kg)   SpO2 93%   BMI 36.84 kg/m   Vitals:   06/17/23 1514  BP: (!) 138/90  Pulse: 73  Height: 6\' 1"  (1.854 m)  Weight: 279 lb 3.2 oz (126.6 kg)  SpO2: 93%  BMI (Calculated): 36.84    Physical Exam Nursing note reviewed.  Constitutional:      Appearance: Normal appearance. He is normal weight.  HENT:     Head: Normocephalic and atraumatic.     Nose: Nose normal.     Mouth/Throat:     Mouth: Mucous membranes are moist.     Pharynx: Oropharynx is clear.  Eyes:     Extraocular Movements: Extraocular movements intact.     Conjunctiva/sclera: Conjunctivae normal.     Pupils: Pupils are equal, round, and reactive to light.  Cardiovascular:     Rate and Rhythm: Normal rate and regular rhythm.     Pulses: Normal pulses.     Heart sounds: Normal heart sounds.  Pulmonary:     Effort: Pulmonary effort is normal.     Breath sounds: Normal breath sounds.  Abdominal:     General: Abdomen is flat. Bowel sounds are normal.     Palpations: Abdomen is soft.  Musculoskeletal:        General: Normal range of motion.     Cervical back: Normal range of motion.  Skin:    General: Skin is warm and dry.  Neurological:     General: No focal deficit present.     Mental Status: He is alert and oriented to person, place, and time.  Psychiatric:        Mood and Affect: Mood normal.         Behavior: Behavior normal.        Thought Content: Thought content normal.        Judgment: Judgment normal.      No results found for any visits on 06/17/23.  Recent Results (from the past 2160 hours)  Urinalysis, Complete     Status: Abnormal   Collection Time: 04/05/23  3:00 PM  Result Value Ref Range   Specific Gravity, UA >  1.030 (H) 1.005 - 1.030   pH, UA 5.5 5.0 - 7.5   Color, UA Yellow Yellow   Appearance Ur Clear Clear   Leukocytes,UA Negative Negative   Protein,UA Negative Negative/Trace   Glucose, UA Negative Negative   Ketones, UA Negative Negative   RBC, UA Trace (A) Negative   Bilirubin, UA Negative Negative   Urobilinogen, Ur 1.0 0.2 - 1.0 mg/dL   Nitrite, UA Negative Negative   Microscopic Examination See below:   Microscopic Examination     Status: Abnormal   Collection Time: 04/05/23  3:00 PM   Urine  Result Value Ref Range   WBC, UA 0-5 0 - 5 /hpf   RBC, Urine 3-10 (A) 0 - 2 /hpf   Epithelial Cells (non renal) 0-10 0 - 10 /hpf   Bacteria, UA Few None seen/Few  PSA     Status: None   Collection Time: 04/05/23  3:11 PM  Result Value Ref Range   Prostate Specific Ag, Serum 2.2 0.0 - 4.0 ng/mL    Comment: Roche ECLIA methodology. According to the American Urological Association, Serum PSA should decrease and remain at undetectable levels after radical prostatectomy. The AUA defines biochemical recurrence as an initial PSA value 0.2 ng/mL or greater followed by a subsequent confirmatory PSA value 0.2 ng/mL or greater. Values obtained with different assay methods or kits cannot be used interchangeably. Results cannot be interpreted as absolute evidence of the presence or absence of malignant disease.   Hemoglobin and hematocrit, blood     Status: None   Collection Time: 04/05/23  3:11 PM  Result Value Ref Range   Hemoglobin 15.2 13.0 - 17.7 g/dL   Hematocrit 16.1 09.6 - 51.0 %  Urinalysis, Complete     Status: Abnormal   Collection Time: 05/14/23  12:55 PM  Result Value Ref Range   Specific Gravity, UA >1.030 (H) 1.005 - 1.030   pH, UA 5.5 5.0 - 7.5   Color, UA Yellow Yellow   Appearance Ur Clear Clear   Leukocytes,UA Negative Negative   Protein,UA Negative Negative/Trace   Glucose, UA Negative Negative   Ketones, UA Negative Negative   RBC, UA Trace (A) Negative   Bilirubin, UA Negative Negative   Urobilinogen, Ur 1.0 0.2 - 1.0 mg/dL   Nitrite, UA Negative Negative   Microscopic Examination See below:   Microscopic Examination     Status: Abnormal   Collection Time: 05/14/23 12:55 PM   Urine  Result Value Ref Range   WBC, UA 0-5 0 - 5 /hpf   RBC, Urine 3-10 (A) 0 - 2 /hpf   Epithelial Cells (non renal) 0-10 0 - 10 /hpf   Mucus, UA Present (A) Not Estab.   Bacteria, UA Few None seen/Few      Assessment & Plan:  Blood work today, will call with results. Continue same medications.   Problem List Items Addressed This Visit       Cardiovascular and Mediastinum   Essential (primary) hypertension - Primary   Relevant Orders   CMP14+EGFR     Other   Prediabetes   Relevant Orders   Hemoglobin A1c   Mixed hyperlipidemia   Relevant Orders   Lipid Profile   Other Visit Diagnoses       Thyroid disorder screening       Relevant Orders   TSH       Return in about 4 months (around 10/18/2023).   Total time spent: 25 minutes  Alica Antu, NP  06/17/2023   This document may have been prepared by [employer] Voice Recognition software and as such may include unintentional dictation errors.

## 2023-06-18 LAB — CMP14+EGFR
ALT: 22 IU/L (ref 0–44)
AST: 26 IU/L (ref 0–40)
Albumin: 4.2 g/dL (ref 3.9–4.9)
Alkaline Phosphatase: 101 IU/L (ref 44–121)
BUN/Creatinine Ratio: 17 (ref 10–24)
BUN: 15 mg/dL (ref 8–27)
Bilirubin Total: 0.3 mg/dL (ref 0.0–1.2)
CO2: 24 mmol/L (ref 20–29)
Calcium: 9.2 mg/dL (ref 8.6–10.2)
Chloride: 106 mmol/L (ref 96–106)
Creatinine, Ser: 0.86 mg/dL (ref 0.76–1.27)
Globulin, Total: 2.6 g/dL (ref 1.5–4.5)
Glucose: 101 mg/dL — ABNORMAL HIGH (ref 70–99)
Potassium: 3.8 mmol/L (ref 3.5–5.2)
Sodium: 141 mmol/L (ref 134–144)
Total Protein: 6.8 g/dL (ref 6.0–8.5)
eGFR: 97 mL/min/{1.73_m2} (ref >59)

## 2023-06-18 LAB — HEMOGLOBIN A1C
Est. average glucose Bld gHb Est-mCnc: 117 mg/dL
Hgb A1c MFr Bld: 5.7 % — ABNORMAL HIGH (ref 4.8–5.6)

## 2023-06-18 LAB — LIPID PANEL
Chol/HDL Ratio: 3.2 ratio (ref 0.0–5.0)
Cholesterol, Total: 103 mg/dL (ref 100–199)
HDL: 32 mg/dL — ABNORMAL LOW (ref >39)
LDL Chol Calc (NIH): 51 mg/dL (ref 0–99)
Triglycerides: 103 mg/dL (ref 0–149)
VLDL Cholesterol Cal: 20 mg/dL (ref 5–40)

## 2023-06-18 LAB — TSH: TSH: 1.94 u[IU]/mL (ref 0.450–4.500)

## 2023-07-01 ENCOUNTER — Ambulatory Visit (INDEPENDENT_AMBULATORY_CARE_PROVIDER_SITE_OTHER): Admitting: Urology

## 2023-07-01 ENCOUNTER — Encounter: Payer: Self-pay | Admitting: Urology

## 2023-07-01 VITALS — BP 136/87 | HR 79 | Ht 73.0 in | Wt 176.0 lb

## 2023-07-01 DIAGNOSIS — N529 Male erectile dysfunction, unspecified: Secondary | ICD-10-CM

## 2023-07-01 DIAGNOSIS — N138 Other obstructive and reflux uropathy: Secondary | ICD-10-CM

## 2023-07-01 DIAGNOSIS — E291 Testicular hypofunction: Secondary | ICD-10-CM | POA: Diagnosis not present

## 2023-07-01 DIAGNOSIS — E349 Endocrine disorder, unspecified: Secondary | ICD-10-CM

## 2023-07-01 DIAGNOSIS — R319 Hematuria, unspecified: Secondary | ICD-10-CM | POA: Diagnosis not present

## 2023-07-01 DIAGNOSIS — N401 Enlarged prostate with lower urinary tract symptoms: Secondary | ICD-10-CM | POA: Diagnosis not present

## 2023-07-01 MED ORDER — TADALAFIL 20 MG PO TABS: 20.0000 mg | ORAL_TABLET | Freq: Every day | ORAL | 3 refills | Status: DC | PRN

## 2023-07-01 MED ORDER — TADALAFIL 5 MG PO TABS: ORAL_TABLET | ORAL | 3 refills | Status: AC

## 2023-07-01 MED ORDER — TESTOSTERONE CYPIONATE 200 MG/ML IM SOLN: 200.0000 mg | INTRAMUSCULAR | 0 refills | Status: DC

## 2023-07-01 MED ORDER — TAMSULOSIN HCL 0.4 MG PO CAPS: 0.4000 mg | ORAL_CAPSULE | Freq: Every day | ORAL | 3 refills | Status: AC

## 2023-07-01 NOTE — Progress Notes (Signed)
 07/01/23 9:49 AM   Sean Sosa Heading [id number]  Referring provider:  Alica Antu, NP [address] [place],  [place] [address]  Urological history  1. Nephrolithiasis - Stone composition 100% calcium oxalate monohydrate - Spontaneous passage 7 mm right UVJ stone 2016 - Left ESWL 10/06/2018 - Underwent left stent placement with Dr. Cherylene Corrente on 10/12/2018 - Underwent left URS with Dr. Cherylene Corrente on 11/08/2018 - CTU 2023 - punctate nonobstructive left lower pole stones - RUS (2025) -punctate stones in the inferior pole of left kidney   2. BPH with LU TS - PSA pending  - tamsulosin  0.4 mg daily   3. ED - contributing factors of age, smoker, sleep apnea, BPH, HTN and HLD - failed PDE5i's   4. High risk hematuria - smoker - CTU 2023 - NED - cysto 2023 - prominent BPH with hypervascularity  - RUS (2025) -punctate stones inferior pole left kidney - cysto (2025) -prominent BPH with hypervascularity  5.  Hypogonadism - Contributing factors of age, sleep apnea, - Managed on TRT; testosterone  cypionate-200 mg every 2 weeks to  - Hemoglobin/hematocrit pending  - Testosterone  level pending   Chief Complaint  Patient presents with   Follow-up    HPI: Sean Sosa is a 63 y.o.male who presents today for follow up with his wife, Sean Sosa.   Previous records reviewed.   I PSS 7/0  He has urinary frequency secondary to his Lasix .  Patient denies any modifying or aggravating factors.  Patient denies any recent UTI's, gross hematuria, dysuria or suprapubic/flank pain.  Patient denies any fevers, chills, nausea or vomiting.   He is taking tamsulosin  0.4 mg daily and tadalafil  5 mg daily.   IPSS     Row Name 07/01/23 0900         International Prostate Symptom Score   How often have you had the sensation of not emptying your bladder? Less than 1 in 5     How often have you had to urinate less than every two hours? About half the time     How often have you  found you stopped and started again several times when you urinated? Not at All     How often have you found it difficult to postpone urination? Not at All     How often have you had a weak urinary stream? About half the time     How often have you had to strain to start urination? Not at All     How many times did you typically get up at night to urinate? None     Total IPSS Score 7       Quality of Life due to urinary symptoms   If you were to spend the rest of your life with your urinary condition just the way it is now how would you feel about that? Delighted               Score:  1-7 Mild 8-19 Moderate 20-35 Severe   SHIM 7  Patient is not having spontaneous erections.  He denies any pain or curvature with erections.  He has not been augmenting with the tadalafil  20 mg on demand dosing.  He is taking tadalafil  5 mg daily.  He does not notice any more robust erections when his testosterone  is at therapeutic levels.   SHIM     Row Name 07/01/23 0919         SHIM: Over the last 6 months:  How do you rate your confidence that you could get and keep an erection? Very Low     When you had erections with sexual stimulation, how often were your erections hard enough for penetration (entering your partner)? Almost Never or Never     During sexual intercourse, how often were you able to maintain your erection after you had penetrated (entered) your partner? A Few Times (much less than half the time)     During sexual intercourse, how difficult was it to maintain your erection to completion of intercourse? Extremely Difficult     When you attempted sexual intercourse, how often was it satisfactory for you? A Few Times (much less than half the time)       SHIM Total Score   SHIM 7               Score: 1-7 Severe ED 8-11 Moderate ED 12-16 Mild-Moderate ED 17-21 Mild ED 22-25 No ED   PMH: Past Medical History:  Diagnosis Date   History of kidney stones     Hypercholesteremia    Nephrolithiasis    Sleep apnea    Patient reports sleep study was neg.    Surgical History: Past Surgical History:  Procedure Laterality Date   CYSTOSCOPY W/ RETROGRADES Left 10/12/2018   Procedure: CYSTOSCOPY WITH RETROGRADE PYELOGRAM;  Surgeon: Geraline Knapp, MD;  Location: [facility];  Service: Urology;  Laterality: Left;   CYSTOSCOPY/URETEROSCOPY/HOLMIUM LASER/STENT PLACEMENT Left 10/12/2018   Procedure: CYSTOSCOPY/URETEROSCOPY/STENT PLACEMENT;  Surgeon: Geraline Knapp, MD;  Location: [facility];  Service: Urology;  Laterality: Left;   CYSTOSCOPY/URETEROSCOPY/HOLMIUM LASER/STENT PLACEMENT Left 11/08/2018   Procedure: CYSTOSCOPY/URETEROSCOPY/HOLMIUM LASER/STENT PLACEMENT;  Surgeon: Geraline Knapp, MD;  Location: [facility];  Service: Urology;  Laterality: Left;   EXTRACORPOREAL SHOCK WAVE LITHOTRIPSY Left 10/06/2018   Procedure: EXTRACORPOREAL SHOCK WAVE LITHOTRIPSY (ESWL);  Surgeon: Geraline Knapp, MD;  Location: [facility];  Service: Urology;  Laterality: Left;   HAND SURGERY Left 1999   circular saw cut tendons and ligaments   HERNIA REPAIR  1970's    Home Medications:  Allergies as of 07/01/2023   No Known Allergies      Medication List        Accurate as of Jul 01, 2023  9:49 AM. If you have any questions, ask your nurse or doctor.          2-3CC SYRINGE 3 ML Misc 1 mg by Does not apply route every 14 (fourteen) days.   aspirin EC 81 MG tablet Take 81 mg by mouth every evening.   B-D 3CC LUER-LOK SYR 23GX1-1/2 23G X 1-1/2" 3 ML Misc Generic drug: SYRINGE-NEEDLE (DISP) 3 ML USE EVERY 14 (FOURTEEN) DAYS.   BD Disp Needles 18G X 1-1/2" Misc Generic drug: NEEDLE (DISP) 18 G 1 mg by Does not apply route every 14 (fourteen) days.   BD Disp Needles 21G X 1-1/2" Misc Generic drug: NEEDLE (DISP) 21 G 1 mg by Does not apply route every 14 (fourteen) days.   furosemide  40 MG tablet Commonly known as: LASIX  Take 1 tablet (40 mg total) by mouth  daily.   Potassium Chloride  ER 20 MEQ Tbcr TAKE 1 TABLET BY MOUTH EVERY DAY   rosuvastatin 5 MG tablet Commonly known as: CRESTOR Take 5 mg by mouth daily.   tadalafil  5 MG tablet Commonly known as: CIALIS  Take 5 mg tadalafil  daily What changed:  how much to take how to take this when to take this reasons to take  this additional instructions Changed by: Manya Balash   tadalafil  20 MG tablet Commonly known as: CIALIS  Take 1 tablet (20 mg total) by mouth daily as needed for erectile dysfunction. What changed: Another medication with the same name was changed. Make sure you understand how and when to take each. Changed by: Matilde Son   tamsulosin  0.4 MG Caps capsule Commonly known as: FLOMAX  Take 1 capsule (0.4 mg total) by mouth daily.   testosterone  cypionate 200 MG/ML injection Commonly known as: DEPOTESTOSTERONE CYPIONATE Inject 1 mL (200 mg total) into the muscle every 14 (fourteen) days.        Allergies:  No Known Allergies  Family History: Family History  Problem Relation Age of Onset   Prostate cancer Father    Hematuria Father     Social History:  reports that he has been smoking cigarettes. He has never used smokeless tobacco. He reports that he does not drink alcohol and does not use drugs.   Physical Exam: BP 136/87   Pulse 79   Ht 6\' 1"  (1.854 m)   Wt 176 lb (79.8 kg)   BMI 23.22 kg/m   Constitutional:  Well nourished. Alert and oriented, No acute distress. HEENT: [place] AT, moist mucus membranes.  Trachea midline Cardiovascular: No clubbing, cyanosis, or edema. Respiratory: Normal respiratory effort, no increased work of breathing. Neurologic: Grossly intact, no focal deficits, moving all 4 extremities. Psychiatric: Normal mood and affect.   Laboratory Data: Component     Latest Ref Rng 06/17/2023  TSH     0.450 - 4.500 uIU/mL 1.940     Component     Latest Ref Rng 06/17/2023  Hemoglobin A1C     4.8 - 5.6 % 5.7 (H)   Est. average  glucose Bld gHb Est-mCnc     mg/dL 161     Legend: (H) High  Lipid Panel     Component Value Date/Time   CHOL 103 06/17/2023 1537   TRIG 103 06/17/2023 1537   HDL 32 (L) 06/17/2023 1537   CHOLHDL 3.2 06/17/2023 1537   LDLCALC 51 06/17/2023 1537   LABVLDL 20 06/17/2023 1537    CMP     Component Value Date/Time   NA 141 06/17/2023 1537   K 3.8 06/17/2023 1537   CL 106 06/17/2023 1537   CO2 24 06/17/2023 1537   GLUCOSE 101 (H) 06/17/2023 1537   GLUCOSE 169 (H) 07/26/2022 0652   BUN 15 06/17/2023 1537   CREATININE 0.86 06/17/2023 1537   CALCIUM 9.2 06/17/2023 1537   PROT 6.8 06/17/2023 1537   ALBUMIN 4.2 06/17/2023 1537   AST 26 06/17/2023 1537   ALT 22 06/17/2023 1537   ALKPHOS 101 06/17/2023 1537   BILITOT 0.3 06/17/2023 1537   EGFR 97 06/17/2023 1537   GFRNONAA >60 07/26/2022 0652    Testosterone , hemoglobin, hematocrit and PSA pending I have reviewed the labs.    Assessment & Plan:    1.  Hypogonadism -Testosterone  level pending -Hemoglobin/hematocrit pending -continue testosterone  cypionate 200 mg/mL, 1 cc every 14 days   2. BPH with LUTS -PSA pending -most bothersome symptoms are mild urgency  -continue conservative management, avoiding bladder irritants and timed voiding's -Tamsulosin  0.4 mg daily  3. ED -Tadalafil  5 mg daily - Boost with tadalafil  20 mg on-demand dosing  4. High risk hematuria -smoker -Work-up in 2023 prostatic hypervascularity and nephrolithiasis -work-up 2025 -prostatic hypervascularity and nephrolithiasis -no reports of gross heme  Return in about 3 months (around 10/01/2023) for Testosterone , hemoglobin and  hematocrit only.  Briant Camper   [facility] [address], [address] [place], [place] [address] [phone]

## 2023-07-02 ENCOUNTER — Ambulatory Visit: Payer: Self-pay | Admitting: Urology

## 2023-07-02 LAB — HEMOGLOBIN AND HEMATOCRIT, BLOOD
Hematocrit: 50 % (ref 37.5–51.0)
Hemoglobin: 16.4 g/dL (ref 13.0–17.7)

## 2023-07-02 LAB — TESTOSTERONE: Testosterone: 310 ng/dL (ref 264–916)

## 2023-07-02 LAB — PSA: Prostate Specific Ag, Serum: 4.9 ng/mL — ABNORMAL HIGH (ref 0.0–4.0)

## 2023-07-08 ENCOUNTER — Other Ambulatory Visit

## 2023-07-08 ENCOUNTER — Other Ambulatory Visit: Payer: Self-pay

## 2023-07-08 DIAGNOSIS — N138 Other obstructive and reflux uropathy: Secondary | ICD-10-CM

## 2023-07-09 ENCOUNTER — Ambulatory Visit: Payer: Self-pay | Admitting: Urology

## 2023-07-09 LAB — PSA, TOTAL AND FREE
PSA, Free Pct: 19.2 %
PSA, Free: 0.73 ng/mL
Prostate Specific Ag, Serum: 3.8 ng/mL (ref 0.0–4.0)

## 2023-08-08 ENCOUNTER — Other Ambulatory Visit: Payer: Self-pay | Admitting: Cardiology

## 2023-08-19 ENCOUNTER — Other Ambulatory Visit

## 2023-08-19 ENCOUNTER — Other Ambulatory Visit: Payer: Self-pay

## 2023-08-19 DIAGNOSIS — N138 Other obstructive and reflux uropathy: Secondary | ICD-10-CM

## 2023-08-19 DIAGNOSIS — E291 Testicular hypofunction: Secondary | ICD-10-CM

## 2023-08-20 LAB — PSA, TOTAL AND FREE
PSA, Free Pct: 33.8 %
PSA, Free: 0.44 ng/mL
Prostate Specific Ag, Serum: 1.3 ng/mL (ref 0.0–4.0)

## 2023-08-20 LAB — HEMOGLOBIN AND HEMATOCRIT, BLOOD
Hematocrit: 47.4 % (ref 37.5–51.0)
Hemoglobin: 16 g/dL (ref 13.0–17.7)

## 2023-08-20 LAB — TESTOSTERONE: Testosterone: 256 ng/dL — ABNORMAL LOW (ref 264–916)

## 2023-08-24 ENCOUNTER — Ambulatory Visit: Payer: Self-pay | Admitting: Urology

## 2023-09-08 ENCOUNTER — Other Ambulatory Visit: Payer: Self-pay | Admitting: Urology

## 2023-09-08 DIAGNOSIS — E349 Endocrine disorder, unspecified: Secondary | ICD-10-CM

## 2023-09-09 ENCOUNTER — Telehealth: Payer: Self-pay | Admitting: Urology

## 2023-09-09 NOTE — Telephone Encounter (Signed)
 I need for him to schedule a lab appointment in August for testosterone , hemoglobin, hematocrit in August before I can fill his testosterone .

## 2023-09-09 NOTE — Telephone Encounter (Signed)
Called patient and patient understood

## 2023-09-11 ENCOUNTER — Other Ambulatory Visit: Payer: Self-pay | Admitting: Cardiology

## 2023-09-16 ENCOUNTER — Other Ambulatory Visit: Payer: Self-pay

## 2023-09-16 DIAGNOSIS — E291 Testicular hypofunction: Secondary | ICD-10-CM

## 2023-09-17 ENCOUNTER — Other Ambulatory Visit

## 2023-09-17 DIAGNOSIS — E291 Testicular hypofunction: Secondary | ICD-10-CM

## 2023-09-18 LAB — HEMOGLOBIN AND HEMATOCRIT, BLOOD
Hematocrit: 50.8 % (ref 37.5–51.0)
Hemoglobin: 16.2 g/dL (ref 13.0–17.7)

## 2023-09-18 LAB — TESTOSTERONE: Testosterone: 1111 ng/dL — ABNORMAL HIGH (ref 264–916)

## 2023-09-19 ENCOUNTER — Ambulatory Visit: Payer: Self-pay | Admitting: Urology

## 2023-09-19 ENCOUNTER — Other Ambulatory Visit: Payer: Self-pay | Admitting: Urology

## 2023-09-19 DIAGNOSIS — E291 Testicular hypofunction: Secondary | ICD-10-CM

## 2023-09-19 MED ORDER — TESTOSTERONE CYPIONATE 200 MG/ML IM SOLN: 100.0000 mg | INTRAMUSCULAR | 0 refills | Status: DC

## 2023-09-20 NOTE — Progress Notes (Signed)
Called pt and pt understood.

## 2023-09-21 ENCOUNTER — Other Ambulatory Visit: Payer: Self-pay | Admitting: Urology

## 2023-09-21 DIAGNOSIS — E291 Testicular hypofunction: Secondary | ICD-10-CM

## 2023-09-21 MED ORDER — TESTOSTERONE CYPIONATE 200 MG/ML IM SOLN
100.0000 mg | INTRAMUSCULAR | 0 refills | Status: DC
Start: 2023-09-21 — End: 2024-01-04

## 2023-09-22 NOTE — Telephone Encounter (Signed)
 Called [facility] and they are filling it now with the new dictions.

## 2023-09-22 NOTE — Telephone Encounter (Signed)
 Called and talked with the pt and let him know that its being filled

## 2023-10-21 ENCOUNTER — Ambulatory Visit (INDEPENDENT_AMBULATORY_CARE_PROVIDER_SITE_OTHER): Payer: PRIVATE HEALTH INSURANCE | Admitting: Cardiology

## 2023-10-21 ENCOUNTER — Encounter: Payer: Self-pay | Admitting: Cardiology

## 2023-10-21 VITALS — BP 132/86 | HR 88 | Ht 73.0 in | Wt 281.1 lb

## 2023-10-21 DIAGNOSIS — Z1329 Encounter for screening for other suspected endocrine disorder: Secondary | ICD-10-CM

## 2023-10-21 DIAGNOSIS — R7303 Prediabetes: Secondary | ICD-10-CM

## 2023-10-21 DIAGNOSIS — E782 Mixed hyperlipidemia: Secondary | ICD-10-CM | POA: Diagnosis not present

## 2023-10-21 DIAGNOSIS — I10 Essential (primary) hypertension: Secondary | ICD-10-CM | POA: Diagnosis not present

## 2023-10-21 NOTE — Progress Notes (Signed)
 Established Patient Office Visit  Subjective:  Patient ID: Sean Sosa, male    DOB: [birth year]  Age: 63 y.o. MRN: [id number]  Chief Complaint  Patient presents with   Follow-up    4 month follow up     Patient in office for 4 month follow up, did not have blood work done.  Patient doing well, no complaints today.  Due for lab work, will do today.  Continue same medications.     No other concerns at this time.   Past Medical History:  Diagnosis Date   History of kidney stones    Hypercholesteremia    Nephrolithiasis    Sleep apnea    Patient reports sleep study was neg.    Past Surgical History:  Procedure Laterality Date   CYSTOSCOPY W/ RETROGRADES Left 10/12/2018   Procedure: CYSTOSCOPY WITH RETROGRADE PYELOGRAM;  Surgeon: Twylla Glendia BROCKS, MD;  Location: [facility];  Service: Urology;  Laterality: Left;   CYSTOSCOPY/URETEROSCOPY/HOLMIUM LASER/STENT PLACEMENT Left 10/12/2018   Procedure: CYSTOSCOPY/URETEROSCOPY/STENT PLACEMENT;  Surgeon: Twylla Glendia BROCKS, MD;  Location: [facility];  Service: Urology;  Laterality: Left;   CYSTOSCOPY/URETEROSCOPY/HOLMIUM LASER/STENT PLACEMENT Left 11/08/2018   Procedure: CYSTOSCOPY/URETEROSCOPY/HOLMIUM LASER/STENT PLACEMENT;  Surgeon: Twylla Glendia BROCKS, MD;  Location: [facility];  Service: Urology;  Laterality: Left;   EXTRACORPOREAL SHOCK WAVE LITHOTRIPSY Left 10/06/2018   Procedure: EXTRACORPOREAL SHOCK WAVE LITHOTRIPSY (ESWL);  Surgeon: Twylla Glendia BROCKS, MD;  Location: [facility];  Service: Urology;  Laterality: Left;   HAND SURGERY Left 1999   circular saw cut tendons and ligaments   HERNIA REPAIR  1970's    Social History   Socioeconomic History   Marital status: Married    Spouse name: [name]   Number of children: Not on file   Years of education: Not on file   Highest education level: Not on file  Occupational History   Occupation: supervisor  Tobacco Use   Smoking status: Every Day    Current packs/day: 1.50    Types:  Cigarettes   Smokeless tobacco: Never  Vaping Use   Vaping status: Never Used  Substance and Sexual Activity   Alcohol use: No   Drug use: Never   Sexual activity: Not on file  Other Topics Concern   Not on file  Social History Narrative   Not on file   Social Drivers of Health   Financial Resource Strain: Not on file  Food Insecurity: Not on file  Transportation Needs: Not on file  Physical Activity: Not on file  Stress: Not on file  Social Connections: Not on file  Intimate Partner Violence: Not on file    Family History  Problem Relation Age of Onset   Prostate cancer Father    Hematuria Father     No Known Allergies  Outpatient Medications Prior to Visit  Medication Sig   aspirin EC 81 MG tablet Take 81 mg by mouth every evening.    B-D 3CC LUER-LOK SYR 23GX1-1/2 23G X 1-1/2 3 ML MISC USE EVERY 14 (FOURTEEN) DAYS.   cephALEXin (KEFLEX) 500 MG capsule Take 500 mg by mouth every 12 (twelve) hours.   fluticasone (FLONASE) 50 MCG/ACT nasal spray SMARTSIG:2 Both Nares Twice Daily   furosemide  (LASIX ) 40 MG tablet TAKE 1 TABLET BY MOUTH EVERY DAY   NEEDLE, DISP, 18 G (BD DISP NEEDLES) 18G X 1-1/2 MISC 1 mg by Does not apply route every 14 (fourteen) days.   NEEDLE, DISP, 21 G (BD DISP NEEDLES) 21G X 1-1/2 MISC  1 mg by Does not apply route every 14 (fourteen) days.   Potassium Chloride  ER 20 MEQ TBCR TAKE 1 TABLET BY MOUTH EVERY DAY   rosuvastatin (CRESTOR) 5 MG tablet Take 5 mg by mouth daily.   Syringe, Disposable, (2-3CC SYRINGE) 3 ML MISC 1 mg by Does not apply route every 14 (fourteen) days.   tadalafil  (CIALIS ) 20 MG tablet Take 1 tablet (20 mg total) by mouth daily as needed for erectile dysfunction.   tadalafil  (CIALIS ) 5 MG tablet Take 5 mg tadalafil  daily   tamsulosin  (FLOMAX ) 0.4 MG CAPS capsule Take 1 capsule (0.4 mg total) by mouth daily.   testosterone  cypionate (DEPOTESTOSTERONE CYPIONATE) 200 MG/ML injection Inject 0.5 mLs (100 mg total) into the muscle  every 14 (fourteen) days.   valACYclovir (VALTREX) 1000 MG tablet Take 1,000 mg by mouth 3 (three) times daily.   No facility-administered medications prior to visit.    Review of Systems  Constitutional: Negative.   HENT: Negative.    Eyes: Negative.   Respiratory: Negative.  Negative for shortness of breath.   Cardiovascular: Negative.  Negative for chest pain.  Gastrointestinal: Negative.  Negative for abdominal pain, constipation and diarrhea.  Genitourinary: Negative.   Musculoskeletal:  Negative for joint pain and myalgias.  Skin: Negative.   Neurological: Negative.  Negative for dizziness and headaches.  Endo/Heme/Allergies: Negative.   All other systems reviewed and are negative.      Objective:   BP 132/86   Pulse 88   Ht 6' 1 (1.854 m)   Wt 281 lb 1.6 oz (127.5 kg)   SpO2 94%   BMI 37.09 kg/m   Vitals:   10/21/23 1519  BP: 132/86  Pulse: 88  Height: 6' 1 (1.854 m)  Weight: 281 lb 1.6 oz (127.5 kg)  SpO2: 94%  BMI (Calculated): 37.09    Physical Exam Nursing note reviewed.  Constitutional:      Appearance: Normal appearance. He is normal weight.  HENT:     Head: Normocephalic and atraumatic.     Nose: Nose normal.     Mouth/Throat:     Mouth: Mucous membranes are moist.     Pharynx: Oropharynx is clear.  Eyes:     Extraocular Movements: Extraocular movements intact.     Conjunctiva/sclera: Conjunctivae normal.     Pupils: Pupils are equal, round, and reactive to light.  Cardiovascular:     Rate and Rhythm: Normal rate and regular rhythm.     Pulses: Normal pulses.     Heart sounds: Normal heart sounds.  Pulmonary:     Effort: Pulmonary effort is normal.     Breath sounds: Normal breath sounds.  Abdominal:     General: Abdomen is flat. Bowel sounds are normal.     Palpations: Abdomen is soft.  Musculoskeletal:        General: Normal range of motion.     Cervical back: Normal range of motion.  Skin:    General: Skin is warm and dry.   Neurological:     General: No focal deficit present.     Mental Status: He is alert and oriented to person, place, and time.  Psychiatric:        Mood and Affect: Mood normal.        Behavior: Behavior normal.        Thought Content: Thought content normal.        Judgment: Judgment normal.      No results found for any visits on  10/21/23.  Recent Results (from the past 2160 hours)  PSA, total and free     Status: None   Collection Time: 08/19/23  3:05 PM  Result Value Ref Range   Prostate Specific Ag, Serum 1.3 0.0 - 4.0 ng/mL    Comment: Roche ECLIA methodology. According to the American Urological Association, Serum PSA should decrease and remain at undetectable levels after radical prostatectomy. The AUA defines biochemical recurrence as an initial PSA value 0.2 ng/mL or greater followed by a subsequent confirmatory PSA value 0.2 ng/mL or greater. Values obtained with different assay methods or kits cannot be used interchangeably. Results cannot be interpreted as absolute evidence of the presence or absence of malignant disease.    PSA, Free 0.44 N/A ng/mL    Comment: Roche ECLIA methodology.   PSA, Free Pct 33.8 %    Comment: The table below lists the probability of prostate cancer for men with non-suspicious DRE results and total PSA between 4 and 10 ng/mL, by patient age Jaycee rosemarie cherry, JAMA 1998, 720:8457).                   % Free PSA       50-64 yr        65-75 yr                   0.00-10.00%        56%             55%                  10.01-15.00%        24%             35%                  15.01-20.00%        17%             23%                  20.01-25.00%        10%             20%                       >25.00%         5%              9% Please note:  Catalona et al did not make specific               recommendations regarding the use of               percent free PSA for any other population               of men.   Testosterone      Status: Abnormal    Collection Time: 08/19/23  3:05 PM  Result Value Ref Range   Testosterone  256 (L) 264 - 916 ng/dL    Comment: Adult male reference interval is based on a population of healthy nonobese males (BMI <30) between 60 and 59 years old. Travison, et.al. JCEM [id number]. PMID: 71675896.   Hemoglobin and hematocrit, blood     Status: None   Collection Time: 08/19/23  3:05 PM  Result Value Ref Range   Hemoglobin 16.0 13.0 - 17.7 g/dL   Hematocrit 52.5 62.4 - 51.0 %  Testosterone      Status: Abnormal  Collection Time: 09/17/23  2:21 PM  Result Value Ref Range   Testosterone  1,111 (H) 264 - 916 ng/dL    Comment: Adult male reference interval is based on a population of healthy nonobese males (BMI <30) between 74 and 39 years old. Travison, et.al. JCEM [phone]. PMID: 71675896.   Hemoglobin and hematocrit, blood     Status: None   Collection Time: 09/17/23  2:21 PM  Result Value Ref Range   Hemoglobin 16.2 13.0 - 17.7 g/dL   Hematocrit 49.1 62.4 - 51.0 %      Assessment & Plan:  Continue same medications.  Problem List Items Addressed This Visit       Cardiovascular and Mediastinum   Essential (primary) hypertension - Primary   Relevant Orders   CMP14+EGFR     Other   Prediabetes   Relevant Orders   Hemoglobin A1c   Mixed hyperlipidemia   Relevant Orders   Lipid Profile   Other Visit Diagnoses       Thyroid disorder screening       Relevant Orders   TSH       Return in about 4 months (around 02/20/2024) for fasting lab work prior.   Total time spent: 25 minutes  [employer], NP  10/21/2023   This document may have been prepared by Dragon Voice Recognition software and as such may include unintentional dictation errors.

## 2023-10-22 ENCOUNTER — Ambulatory Visit: Payer: Self-pay | Admitting: Cardiology

## 2023-10-22 LAB — HEMOGLOBIN A1C
Est. average glucose Bld gHb Est-mCnc: 111 mg/dL
Hgb A1c MFr Bld: 5.5 % (ref 4.8–5.6)

## 2023-10-22 LAB — CMP14+EGFR
ALT: 13 IU/L (ref 0–44)
AST: 14 IU/L (ref 0–40)
Albumin: 4.1 g/dL (ref 3.9–4.9)
Alkaline Phosphatase: 109 IU/L (ref 44–121)
BUN/Creatinine Ratio: 13 (ref 10–24)
BUN: 12 mg/dL (ref 8–27)
Bilirubin Total: 0.4 mg/dL (ref 0.0–1.2)
CO2: 23 mmol/L (ref 20–29)
Calcium: 8.8 mg/dL (ref 8.6–10.2)
Chloride: 103 mmol/L (ref 96–106)
Creatinine, Ser: 0.89 mg/dL (ref 0.76–1.27)
Globulin, Total: 2.7 g/dL (ref 1.5–4.5)
Glucose: 90 mg/dL (ref 70–99)
Potassium: 3.9 mmol/L (ref 3.5–5.2)
Sodium: 140 mmol/L (ref 134–144)
Total Protein: 6.8 g/dL (ref 6.0–8.5)
eGFR: 96 mL/min/1.73 (ref >59)

## 2023-10-22 LAB — LIPID PANEL
Chol/HDL Ratio: 3.7 ratio (ref 0.0–5.0)
Cholesterol, Total: 119 mg/dL (ref 100–199)
HDL: 32 mg/dL — ABNORMAL LOW (ref >39)
LDL Chol Calc (NIH): 68 mg/dL (ref 0–99)
Triglycerides: 100 mg/dL (ref 0–149)
VLDL Cholesterol Cal: 19 mg/dL (ref 5–40)

## 2023-10-22 LAB — TSH: TSH: 2.58 u[IU]/mL (ref 0.450–4.500)

## 2023-10-29 ENCOUNTER — Other Ambulatory Visit: Payer: Self-pay | Admitting: Urology

## 2023-10-29 DIAGNOSIS — N529 Male erectile dysfunction, unspecified: Secondary | ICD-10-CM

## 2023-12-19 ENCOUNTER — Other Ambulatory Visit: Payer: Self-pay | Admitting: Urology

## 2023-12-19 DIAGNOSIS — E291 Testicular hypofunction: Secondary | ICD-10-CM

## 2023-12-20 ENCOUNTER — Other Ambulatory Visit: Payer: Self-pay

## 2023-12-20 DIAGNOSIS — E291 Testicular hypofunction: Secondary | ICD-10-CM

## 2023-12-21 ENCOUNTER — Other Ambulatory Visit

## 2023-12-21 DIAGNOSIS — E291 Testicular hypofunction: Secondary | ICD-10-CM

## 2023-12-22 ENCOUNTER — Ambulatory Visit: Payer: Self-pay | Admitting: Urology

## 2023-12-22 LAB — HEMOGLOBIN AND HEMATOCRIT, BLOOD
Hematocrit: 52.3 % — ABNORMAL HIGH (ref 37.5–51.0)
Hemoglobin: 17 g/dL (ref 13.0–17.7)

## 2023-12-22 LAB — PSA: Prostate Specific Ag, Serum: 2 ng/mL (ref 0.0–4.0)

## 2023-12-22 LAB — TESTOSTERONE: Testosterone: 536 ng/dL (ref 264–916)

## 2024-01-04 ENCOUNTER — Other Ambulatory Visit: Payer: Self-pay | Admitting: Urology

## 2024-01-04 DIAGNOSIS — E291 Testicular hypofunction: Secondary | ICD-10-CM

## 2024-01-04 MED ORDER — TESTOSTERONE CYPIONATE 200 MG/ML IM SOLN: 100.0000 mg | INTRAMUSCULAR | 0 refills | Status: AC

## 2024-01-29 ENCOUNTER — Other Ambulatory Visit: Payer: Self-pay | Admitting: Cardiology

## 2024-02-24 ENCOUNTER — Ambulatory Visit (INDEPENDENT_AMBULATORY_CARE_PROVIDER_SITE_OTHER): Payer: PRIVATE HEALTH INSURANCE | Admitting: Cardiology

## 2024-02-24 ENCOUNTER — Encounter: Payer: Self-pay | Admitting: Cardiology

## 2024-02-24 VITALS — BP 126/78 | HR 79 | Ht 73.0 in | Wt 294.0 lb

## 2024-02-24 DIAGNOSIS — I10 Essential (primary) hypertension: Secondary | ICD-10-CM | POA: Diagnosis not present

## 2024-02-24 DIAGNOSIS — R7303 Prediabetes: Secondary | ICD-10-CM

## 2024-02-24 DIAGNOSIS — Z6838 Body mass index (BMI) 38.0-38.9, adult: Secondary | ICD-10-CM | POA: Diagnosis not present

## 2024-02-24 DIAGNOSIS — Z1329 Encounter for screening for other suspected endocrine disorder: Secondary | ICD-10-CM | POA: Diagnosis not present

## 2024-02-24 DIAGNOSIS — E782 Mixed hyperlipidemia: Secondary | ICD-10-CM | POA: Diagnosis not present

## 2024-02-24 DIAGNOSIS — Z720 Tobacco use: Secondary | ICD-10-CM

## 2024-02-24 DIAGNOSIS — E66812 Obesity, class 2: Secondary | ICD-10-CM

## 2024-02-24 NOTE — Progress Notes (Signed)
 "  Established Patient Office Visit  Subjective:  Patient ID: Sean Sosa, male    DOB: [birth year]  Age: 64 y.o. MRN: [id number]  Chief Complaint  Patient presents with   Follow-up    4 months follow up    Patient in office for 4 month follow up, did not have fasting lab work done. Patient doing well, no new complaints today.  Patient did not have blood work done, will do today.  Continues to smoke, not interested in quitting.  Continue current medications.     No other concerns at this time.   Past Medical History:  Diagnosis Date   History of kidney stones    Hypercholesteremia    Nephrolithiasis    Sleep apnea    Patient reports sleep study was neg.    Past Surgical History:  Procedure Laterality Date   CYSTOSCOPY W/ RETROGRADES Left 10/12/2018   Procedure: CYSTOSCOPY WITH RETROGRADE PYELOGRAM;  Surgeon: Twylla Glendia BROCKS, MD;  Location: [facility];  Service: Urology;  Laterality: Left;   CYSTOSCOPY/URETEROSCOPY/HOLMIUM LASER/STENT PLACEMENT Left 10/12/2018   Procedure: CYSTOSCOPY/URETEROSCOPY/STENT PLACEMENT;  Surgeon: Twylla Glendia BROCKS, MD;  Location: [facility];  Service: Urology;  Laterality: Left;   CYSTOSCOPY/URETEROSCOPY/HOLMIUM LASER/STENT PLACEMENT Left 11/08/2018   Procedure: CYSTOSCOPY/URETEROSCOPY/HOLMIUM LASER/STENT PLACEMENT;  Surgeon: Twylla Glendia BROCKS, MD;  Location: [facility];  Service: Urology;  Laterality: Left;   EXTRACORPOREAL SHOCK WAVE LITHOTRIPSY Left 10/06/2018   Procedure: EXTRACORPOREAL SHOCK WAVE LITHOTRIPSY (ESWL);  Surgeon: Twylla Glendia BROCKS, MD;  Location: [facility];  Service: Urology;  Laterality: Left;   HAND SURGERY Left 1999   circular saw cut tendons and ligaments   HERNIA REPAIR  1970's    Social History   Socioeconomic History   Marital status: Married    Spouse name: [name]   Number of children: Not on file   Years of education: Not on file   Highest education level: Not on file  Occupational History   Occupation: supervisor   Tobacco Use   Smoking status: Every Day    Current packs/day: 1.50    Types: Cigarettes   Smokeless tobacco: Never  Vaping Use   Vaping status: Never Used  Substance and Sexual Activity   Alcohol use: No   Drug use: Never   Sexual activity: Not on file  Other Topics Concern   Not on file  Social History Narrative   Not on file   Social Drivers of Health   Tobacco Use: High Risk (02/24/2024)   Patient History    Smoking Tobacco Use: Every Day    Smokeless Tobacco Use: Never    Passive Exposure: Not on file  Financial Resource Strain: Not on file  Food Insecurity: Not on file  Transportation Needs: Not on file  Physical Activity: Not on file  Stress: Not on file  Social Connections: Not on file  Intimate Partner Violence: Not on file  Depression (EYV7-0): Not on file  Alcohol Screen: Not on file  Housing: Unknown (03/10/2023)   Received from [facility]   Epic    Unable to Pay for Housing in the Last Year: Not on file    Number of Times Moved in the Last Year: Not on file    At any time in the past 12 months, were you homeless or living in a shelter (including now)?: No  Utilities: Not on file  Health Literacy: Not on file    Family History  Problem Relation Age of Onset   Prostate cancer Father  Hematuria Father     Allergies[1]  Show/hide medication list[2]  Review of Systems  Constitutional: Negative.   HENT: Negative.    Eyes: Negative.   Respiratory: Negative.  Negative for shortness of breath.   Cardiovascular: Negative.  Negative for chest pain.  Gastrointestinal: Negative.  Negative for abdominal pain, constipation and diarrhea.  Genitourinary: Negative.   Musculoskeletal:  Negative for joint pain and myalgias.  Skin: Negative.   Neurological: Negative.  Negative for dizziness and headaches.  Endo/Heme/Allergies: Negative.   All other systems reviewed and are negative.      Objective:   BP 126/78 (BP Location: Right Arm,  Patient Position: Sitting, Cuff Size: Large)   Pulse 79   Ht 6' 1 (1.854 m)   Wt 294 lb (133.4 kg)   SpO2 92%   BMI 38.79 kg/m   Vitals:   02/24/24 1435 02/24/24 1445  BP: (!) 148/66 126/78  Pulse: 79   Height: 6' 1 (1.854 m)   Weight: 294 lb (133.4 kg)   SpO2: 92%   BMI (Calculated): 38.8     Physical Exam Nursing note reviewed.  Constitutional:      Appearance: Normal appearance. He is normal weight.  HENT:     Head: Normocephalic and atraumatic.     Nose: Nose normal.     Mouth/Throat:     Mouth: Mucous membranes are moist.     Pharynx: Oropharynx is clear.  Eyes:     Extraocular Movements: Extraocular movements intact.     Conjunctiva/sclera: Conjunctivae normal.     Pupils: Pupils are equal, round, and reactive to light.  Cardiovascular:     Rate and Rhythm: Normal rate and regular rhythm.     Pulses: Normal pulses.     Heart sounds: Normal heart sounds.  Pulmonary:     Effort: Pulmonary effort is normal.     Breath sounds: Normal breath sounds.  Abdominal:     General: Abdomen is flat. Bowel sounds are normal.     Palpations: Abdomen is soft.  Musculoskeletal:        General: Normal range of motion.     Cervical back: Normal range of motion.  Skin:    General: Skin is warm and dry.  Neurological:     General: No focal deficit present.     Mental Status: He is alert and oriented to person, place, and time.  Psychiatric:        Mood and Affect: Mood normal.        Behavior: Behavior normal.        Thought Content: Thought content normal.        Judgment: Judgment normal.      No results found for any visits on 02/24/24.  Recent Results (from the past 2160 hours)  Testosterone      Status: None   Collection Time: 12/21/23  4:05 PM  Result Value Ref Range   Testosterone  536 264 - 916 ng/dL    Comment: Adult male reference interval is based on a population of healthy nonobese males (BMI <30) between 52 and 28 years old. Travison, et.al. JCEM  [phone]. PMID: [id number].   PSA     Status: None   Collection Time: 12/21/23  4:05 PM  Result Value Ref Range   Prostate Specific Ag, Serum 2.0 0.0 - 4.0 ng/mL    Comment: Roche ECLIA methodology. According to the American Urological Association, Serum PSA should decrease and remain at undetectable levels after radical prostatectomy. The AUA defines biochemical  recurrence as an initial PSA value 0.2 ng/mL or greater followed by a subsequent confirmatory PSA value 0.2 ng/mL or greater. Values obtained with different assay methods or kits cannot be used interchangeably. Results cannot be interpreted as absolute evidence of the presence or absence of malignant disease.   Hemoglobin and hematocrit, blood     Status: Abnormal   Collection Time: 12/21/23  4:05 PM  Result Value Ref Range   Hemoglobin 17.0 13.0 - 17.7 g/dL   Hematocrit 47.6 (H) 62.4 - 51.0 %      Assessment & Plan:  Blood work today Continue current medications  Problem List Items Addressed This Visit       Cardiovascular and Mediastinum   Essential (primary) hypertension - Primary   Relevant Orders   CMP14+EGFR     Other   Current tobacco use   Prediabetes   Relevant Orders   CMP14+EGFR   Hemoglobin A1c   Mixed hyperlipidemia   Relevant Orders   Lipid Profile   Class 2 severe obesity due to excess calories with serious comorbidity and body mass index (BMI) of 38.0 to 38.9 in adult   Other Visit Diagnoses       Thyroid disorder screening       Relevant Orders   TSH       Return in about 4 months (around 06/23/2024) for fasting lab work prior.   Total time spent: 25 minutes. This time includes review of previous notes and results and patient face to face interaction during today's visit.    Jeoffrey Pollen, NP  02/24/2024   This document may have been prepared by [facility] Voice Recognition software and as such may include unintentional dictation errors.     [1] No Known Allergies [2]   Outpatient Medications Prior to Visit  Medication Sig   aspirin EC 81 MG tablet Take 81 mg by mouth every evening.    B-D 3CC LUER-LOK SYR 23GX1-1/2 23G X 1-1/2 3 ML MISC USE EVERY 14 (FOURTEEN) DAYS.   fluticasone (FLONASE) 50 MCG/ACT nasal spray SMARTSIG:2 Both Nares Twice Daily   furosemide  (LASIX ) 40 MG tablet TAKE 1 TABLET BY MOUTH EVERY DAY   NEEDLE, DISP, 18 G (BD DISP NEEDLES) 18G X 1-1/2 MISC 1 mg by Does not apply route every 14 (fourteen) days.   NEEDLE, DISP, 21 G (BD DISP NEEDLES) 21G X 1-1/2 MISC 1 mg by Does not apply route every 14 (fourteen) days.   Potassium Chloride  ER 20 MEQ TBCR TAKE 1 TABLET BY MOUTH EVERY DAY   rosuvastatin (CRESTOR) 5 MG tablet Take 5 mg by mouth daily.   Syringe, Disposable, (2-3CC SYRINGE) 3 ML MISC 1 mg by Does not apply route every 14 (fourteen) days.   tadalafil  (CIALIS ) 20 MG tablet TAKE 1 TABLET BY MOUTH EVERY DAY AS NEEDED FOR ERECTILE DYSFUNCTION   tadalafil  (CIALIS ) 5 MG tablet Take 5 mg tadalafil  daily   tamsulosin  (FLOMAX ) 0.4 MG CAPS capsule Take 1 capsule (0.4 mg total) by mouth daily.   testosterone  cypionate (DEPOTESTOSTERONE CYPIONATE) 200 MG/ML injection Inject 0.5 mLs (100 mg total) into the muscle every 14 (fourteen) days.   valACYclovir (VALTREX) 1000 MG tablet Take 1,000 mg by mouth 3 (three) times daily.   [DISCONTINUED] cephALEXin (KEFLEX) 500 MG capsule Take 500 mg by mouth every 12 (twelve) hours.   No facility-administered medications prior to visit.   "

## 2024-02-25 ENCOUNTER — Ambulatory Visit: Payer: Self-pay | Admitting: Cardiology

## 2024-02-25 LAB — TSH: TSH: 2.12 u[IU]/mL (ref 0.450–4.500)

## 2024-02-25 LAB — LIPID PANEL
Chol/HDL Ratio: 3.9 ratio (ref 0.0–5.0)
Cholesterol, Total: 118 mg/dL (ref 100–199)
HDL: 30 mg/dL — ABNORMAL LOW
LDL Chol Calc (NIH): 68 mg/dL (ref 0–99)
Triglycerides: 105 mg/dL (ref 0–149)
VLDL Cholesterol Cal: 20 mg/dL (ref 5–40)

## 2024-02-25 LAB — CMP14+EGFR
ALT: 14 IU/L (ref 0–44)
AST: 15 IU/L (ref 0–40)
Albumin: 4.2 g/dL (ref 3.9–4.9)
Alkaline Phosphatase: 94 IU/L (ref 47–123)
BUN/Creatinine Ratio: 13 (ref 10–24)
BUN: 11 mg/dL (ref 8–27)
Bilirubin Total: 0.4 mg/dL (ref 0.0–1.2)
CO2: 23 mmol/L (ref 20–29)
Calcium: 8.8 mg/dL (ref 8.6–10.2)
Chloride: 103 mmol/L (ref 96–106)
Creatinine, Ser: 0.83 mg/dL (ref 0.76–1.27)
Globulin, Total: 2.3 g/dL (ref 1.5–4.5)
Glucose: 88 mg/dL (ref 70–99)
Potassium: 4.2 mmol/L (ref 3.5–5.2)
Sodium: 139 mmol/L (ref 134–144)
Total Protein: 6.5 g/dL (ref 6.0–8.5)
eGFR: 98 mL/min/1.73

## 2024-02-25 LAB — HEMOGLOBIN A1C
Est. average glucose Bld gHb Est-mCnc: 123 mg/dL
Hgb A1c MFr Bld: 5.9 % — ABNORMAL HIGH (ref 4.8–5.6)

## 2024-03-07 ENCOUNTER — Other Ambulatory Visit: Payer: Self-pay

## 2024-03-07 DIAGNOSIS — E291 Testicular hypofunction: Secondary | ICD-10-CM

## 2024-03-11 ENCOUNTER — Other Ambulatory Visit: Payer: Self-pay | Admitting: Cardiology

## 2024-03-21 ENCOUNTER — Other Ambulatory Visit

## 2024-03-21 DIAGNOSIS — E291 Testicular hypofunction: Secondary | ICD-10-CM

## 2024-03-22 ENCOUNTER — Ambulatory Visit: Payer: Self-pay | Admitting: Urology

## 2024-03-22 LAB — TESTOSTERONE: Testosterone: 1087 ng/dL — ABNORMAL HIGH (ref 264–916)

## 2024-03-22 LAB — HEMOGLOBIN AND HEMATOCRIT, BLOOD
Hematocrit: 55.3 % — ABNORMAL HIGH (ref 37.5–51.0)
Hemoglobin: 17.7 g/dL (ref 13.0–17.7)

## 2024-04-10 ENCOUNTER — Ambulatory Visit: Admitting: Urology
